# Patient Record
Sex: Male | Born: 1970 | Race: Black or African American | Hispanic: No | Smoking: Never smoker
Health system: Southern US, Community
[De-identification: ages and names within clinical notes are randomized; demographics above are authoritative.]

## PROBLEM LIST (undated history)

## (undated) DIAGNOSIS — I10 Essential (primary) hypertension: Secondary | ICD-10-CM

---

## 2000-11-29 ENCOUNTER — Emergency Department (HOSPITAL_COMMUNITY): Admission: EM | Admit: 2000-11-29 | Discharge: 2000-11-29 | Payer: Self-pay | Admitting: Emergency Medicine

## 2001-09-24 ENCOUNTER — Emergency Department (HOSPITAL_COMMUNITY): Admission: EM | Admit: 2001-09-24 | Discharge: 2001-09-24 | Payer: Self-pay | Admitting: Emergency Medicine

## 2002-01-12 ENCOUNTER — Emergency Department (HOSPITAL_COMMUNITY): Admission: EM | Admit: 2002-01-12 | Discharge: 2002-01-12 | Payer: Self-pay

## 2003-03-24 ENCOUNTER — Emergency Department (HOSPITAL_COMMUNITY): Admission: AD | Admit: 2003-03-24 | Discharge: 2003-03-24 | Payer: Self-pay | Admitting: Emergency Medicine

## 2003-09-01 ENCOUNTER — Emergency Department (HOSPITAL_COMMUNITY): Admission: AD | Admit: 2003-09-01 | Discharge: 2003-09-01 | Payer: Self-pay | Admitting: Family Medicine

## 2004-05-28 ENCOUNTER — Emergency Department (HOSPITAL_COMMUNITY): Admission: EM | Admit: 2004-05-28 | Discharge: 2004-05-28 | Payer: Self-pay | Admitting: Family Medicine

## 2004-08-06 ENCOUNTER — Emergency Department (HOSPITAL_COMMUNITY): Admission: EM | Admit: 2004-08-06 | Discharge: 2004-08-06 | Payer: Self-pay | Admitting: Emergency Medicine

## 2004-09-02 ENCOUNTER — Emergency Department (HOSPITAL_COMMUNITY): Admission: EM | Admit: 2004-09-02 | Discharge: 2004-09-02 | Payer: Self-pay | Admitting: Emergency Medicine

## 2004-10-20 ENCOUNTER — Emergency Department (HOSPITAL_COMMUNITY): Admission: EM | Admit: 2004-10-20 | Discharge: 2004-10-20 | Payer: Self-pay | Admitting: Family Medicine

## 2005-04-28 ENCOUNTER — Emergency Department (HOSPITAL_COMMUNITY): Admission: EM | Admit: 2005-04-28 | Discharge: 2005-04-28 | Payer: Self-pay | Admitting: Emergency Medicine

## 2005-06-07 ENCOUNTER — Emergency Department (HOSPITAL_COMMUNITY): Admission: EM | Admit: 2005-06-07 | Discharge: 2005-06-08 | Payer: Self-pay | Admitting: Emergency Medicine

## 2005-10-20 ENCOUNTER — Emergency Department (HOSPITAL_COMMUNITY): Admission: EM | Admit: 2005-10-20 | Discharge: 2005-10-20 | Payer: Self-pay | Admitting: Family Medicine

## 2006-02-01 ENCOUNTER — Emergency Department (HOSPITAL_COMMUNITY): Admission: EM | Admit: 2006-02-01 | Discharge: 2006-02-01 | Payer: Self-pay | Admitting: Family Medicine

## 2006-02-02 ENCOUNTER — Emergency Department (HOSPITAL_COMMUNITY): Admission: EM | Admit: 2006-02-02 | Discharge: 2006-02-02 | Payer: Self-pay | Admitting: Emergency Medicine

## 2006-09-14 ENCOUNTER — Emergency Department (HOSPITAL_COMMUNITY): Admission: EM | Admit: 2006-09-14 | Discharge: 2006-09-14 | Payer: Self-pay | Admitting: Emergency Medicine

## 2007-05-08 ENCOUNTER — Emergency Department (HOSPITAL_COMMUNITY): Admission: EM | Admit: 2007-05-08 | Discharge: 2007-05-08 | Payer: Self-pay | Admitting: Emergency Medicine

## 2007-06-11 ENCOUNTER — Emergency Department (HOSPITAL_COMMUNITY): Admission: EM | Admit: 2007-06-11 | Discharge: 2007-06-11 | Payer: Self-pay | Admitting: Family Medicine

## 2007-07-12 ENCOUNTER — Emergency Department (HOSPITAL_COMMUNITY): Admission: EM | Admit: 2007-07-12 | Discharge: 2007-07-12 | Payer: Self-pay | Admitting: Family Medicine

## 2008-04-20 ENCOUNTER — Emergency Department (HOSPITAL_COMMUNITY): Admission: EM | Admit: 2008-04-20 | Discharge: 2008-04-20 | Payer: Self-pay | Admitting: Family Medicine

## 2008-06-26 ENCOUNTER — Emergency Department (HOSPITAL_COMMUNITY): Admission: EM | Admit: 2008-06-26 | Discharge: 2008-06-27 | Payer: Self-pay | Admitting: Emergency Medicine

## 2008-07-24 ENCOUNTER — Emergency Department (HOSPITAL_COMMUNITY): Admission: EM | Admit: 2008-07-24 | Discharge: 2008-07-24 | Payer: Self-pay | Admitting: Emergency Medicine

## 2010-11-04 ENCOUNTER — Emergency Department (HOSPITAL_COMMUNITY)
Admission: EM | Admit: 2010-11-04 | Discharge: 2010-11-04 | Payer: Self-pay | Source: Home / Self Care | Admitting: Family Medicine

## 2011-02-05 LAB — POCT URINALYSIS DIPSTICK
Glucose, UA: NEGATIVE mg/dL
Nitrite: NEGATIVE
Protein, ur: 30 mg/dL — AB
Specific Gravity, Urine: >=1.03 (ref 1.005–1.030)
Urobilinogen, UA: 1 mg/dL (ref 0.0–1.0)

## 2011-02-05 LAB — GC/CHLAMYDIA PROBE AMP, GENITAL: GC Probe Amp, Genital: NEGATIVE

## 2011-04-09 ENCOUNTER — Emergency Department (HOSPITAL_COMMUNITY): Payer: Self-pay

## 2011-04-09 ENCOUNTER — Emergency Department (HOSPITAL_COMMUNITY)
Admission: EM | Admit: 2011-04-09 | Discharge: 2011-04-09 | Disposition: A | Discharge status: Home / Self Care | Payer: Self-pay | Attending: Emergency Medicine | Admitting: Emergency Medicine

## 2011-04-09 DIAGNOSIS — R945 Abnormal results of liver function studies: Secondary | ICD-10-CM | POA: Insufficient documentation

## 2011-04-09 DIAGNOSIS — R10819 Abdominal tenderness, unspecified site: Secondary | ICD-10-CM | POA: Insufficient documentation

## 2011-04-09 DIAGNOSIS — M545 Low back pain, unspecified: Secondary | ICD-10-CM | POA: Insufficient documentation

## 2011-04-09 DIAGNOSIS — R142 Eructation: Secondary | ICD-10-CM | POA: Insufficient documentation

## 2011-04-09 DIAGNOSIS — R141 Gas pain: Secondary | ICD-10-CM | POA: Insufficient documentation

## 2011-04-09 DIAGNOSIS — R109 Unspecified abdominal pain: Secondary | ICD-10-CM | POA: Insufficient documentation

## 2011-04-09 DIAGNOSIS — I10 Essential (primary) hypertension: Secondary | ICD-10-CM | POA: Insufficient documentation

## 2011-04-09 DIAGNOSIS — R143 Flatulence: Secondary | ICD-10-CM | POA: Insufficient documentation

## 2011-04-09 LAB — COMPREHENSIVE METABOLIC PANEL
ALT: 66 U/L — ABNORMAL HIGH (ref 0–53)
Albumin: 3.7 g/dL (ref 3.5–5.2)
BUN: 13 mg/dL (ref 6–23)
Calcium: 9.5 mg/dL (ref 8.4–10.5)
Glucose, Bld: 93 mg/dL (ref 70–99)
Sodium: 136 mEq/L (ref 135–145)
Total Protein: 8.1 g/dL (ref 6.0–8.3)

## 2011-04-09 LAB — URINALYSIS, ROUTINE W REFLEX MICROSCOPIC
Nitrite: NEGATIVE
Specific Gravity, Urine: 1.027 (ref 1.005–1.030)
pH: 6.5 (ref 5.0–8.0)

## 2011-04-09 LAB — CBC
HCT: 43.9 % (ref 39.0–52.0)
MCHC: 34.4 g/dL (ref 30.0–36.0)
Platelets: 251 10*3/uL (ref 150–400)
RDW: 14.7 % (ref 11.5–15.5)

## 2011-04-09 LAB — DIFFERENTIAL
Basophils Absolute: 0 10*3/uL (ref 0.0–0.1)
Eosinophils Absolute: 0.2 10*3/uL (ref 0.0–0.7)
Eosinophils Relative: 2 % (ref 0–5)
Lymphocytes Relative: 22 % (ref 12–46)
Monocytes Absolute: 0.9 10*3/uL (ref 0.1–1.0)

## 2011-04-09 LAB — LIPASE, BLOOD: Lipase: 20 U/L (ref 11–59)

## 2011-08-22 LAB — POCT URINALYSIS DIP (DEVICE)
Nitrite: NEGATIVE
Operator id: 247071
Protein, ur: NEGATIVE
Urobilinogen, UA: 0.2

## 2011-08-22 LAB — GC/CHLAMYDIA PROBE AMP, GENITAL: Chlamydia, DNA Probe: NEGATIVE

## 2011-08-24 LAB — POCT I-STAT, CHEM 8
Hemoglobin: 15.6
Sodium: 141
TCO2: 28

## 2011-09-07 LAB — POCT URINALYSIS DIP (DEVICE)
Glucose, UA: NEGATIVE
Nitrite: NEGATIVE
Operator id: 240961
Urobilinogen, UA: 0.2

## 2011-09-13 LAB — GC/CHLAMYDIA PROBE AMP, GENITAL: Chlamydia, DNA Probe: NEGATIVE

## 2011-10-21 ENCOUNTER — Emergency Department (HOSPITAL_COMMUNITY)
Admission: EM | Admit: 2011-10-21 | Discharge: 2011-10-22 | Disposition: A | Discharge status: Home / Self Care | Payer: BC Managed Care – PPO | Attending: Emergency Medicine | Admitting: Emergency Medicine

## 2011-10-21 ENCOUNTER — Encounter: Payer: Self-pay | Admitting: Emergency Medicine

## 2011-10-21 DIAGNOSIS — M79609 Pain in unspecified limb: Secondary | ICD-10-CM | POA: Insufficient documentation

## 2011-10-21 DIAGNOSIS — Z79899 Other long term (current) drug therapy: Secondary | ICD-10-CM | POA: Insufficient documentation

## 2011-10-21 DIAGNOSIS — R142 Eructation: Secondary | ICD-10-CM | POA: Insufficient documentation

## 2011-10-21 DIAGNOSIS — E119 Type 2 diabetes mellitus without complications: Secondary | ICD-10-CM | POA: Diagnosis present

## 2011-10-21 DIAGNOSIS — J189 Pneumonia, unspecified organism: Secondary | ICD-10-CM | POA: Insufficient documentation

## 2011-10-21 DIAGNOSIS — F17200 Nicotine dependence, unspecified, uncomplicated: Secondary | ICD-10-CM | POA: Insufficient documentation

## 2011-10-21 DIAGNOSIS — R141 Gas pain: Secondary | ICD-10-CM | POA: Insufficient documentation

## 2011-10-21 DIAGNOSIS — R109 Unspecified abdominal pain: Secondary | ICD-10-CM | POA: Insufficient documentation

## 2011-10-21 DIAGNOSIS — I10 Essential (primary) hypertension: Secondary | ICD-10-CM | POA: Diagnosis present

## 2011-10-21 DIAGNOSIS — R7401 Elevation of levels of liver transaminase levels: Secondary | ICD-10-CM | POA: Insufficient documentation

## 2011-10-21 DIAGNOSIS — K429 Umbilical hernia without obstruction or gangrene: Secondary | ICD-10-CM

## 2011-10-21 DIAGNOSIS — R7402 Elevation of levels of lactic acid dehydrogenase (LDH): Secondary | ICD-10-CM | POA: Insufficient documentation

## 2011-10-21 HISTORY — DX: Essential (primary) hypertension: I10

## 2011-10-21 LAB — CBC
Platelets: 234 10*3/uL (ref 150–400)
RBC: 5.2 MIL/uL (ref 4.22–5.81)
WBC: 7.7 10*3/uL (ref 4.0–10.5)

## 2011-10-21 LAB — DIFFERENTIAL
Basophils Absolute: 0 10*3/uL (ref 0.0–0.1)
Lymphocytes Relative: 27 % (ref 12–46)
Lymphs Abs: 2.1 10*3/uL (ref 0.7–4.0)
Neutro Abs: 4.6 10*3/uL (ref 1.7–7.7)
Neutrophils Relative %: 60 % (ref 43–77)

## 2011-10-21 LAB — LIPASE, BLOOD: Lipase: 23 U/L (ref 11–59)

## 2011-10-21 LAB — COMPREHENSIVE METABOLIC PANEL
AST: 56 U/L — ABNORMAL HIGH (ref 0–37)
CO2: 23 mEq/L (ref 19–32)
Calcium: 9.4 mg/dL (ref 8.4–10.5)
Creatinine, Ser: 1.18 mg/dL (ref 0.50–1.35)
GFR calc Af Amer: 88 mL/min — ABNORMAL LOW (ref >90)
GFR calc non Af Amer: 76 mL/min — ABNORMAL LOW (ref >90)

## 2011-10-21 NOTE — ED Notes (Signed)
REPORT GIVEN TO CDU NURSE , UNABLE TO GIVE URINE SPECIMEN AT THIS TIME , INTRUCTIONS GIVEN , ORAL CONTRAST GIVEN TO PT. BY CT TECH.

## 2011-10-21 NOTE — ED Notes (Signed)
PT. 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

## 2011-10-21 NOTE — ED Provider Notes (Addendum)
History     CSN: [id number] Arrival date & time: 10/21/2011  8:24 PM   First MD Initiated Contact with Patient 10/21/11 2302      Chief Complaint  Patient presents with  . Abdominal Pain    (Consider location/radiation/quality/duration/timing/severity/associated sxs/prior treatment) HPI 40 year old gentleman presents to our department with complaint of lower abdominal pain extending into his legs over the last 2-3 months. Patient reports pain is mainly when he is up and walking. He denies any weight loss but does report his belly is getting bigger. Patient denies any nausea vomiting or diarrhea. No problems with constipation. No fevers or chills. Patient was seen in the emergency department in may for abdominal pain, and at that time he had elevated LFTs. Patient reports he followed up with his primary care Dr. but no action was taken for it. Labs today show persistent elevation in LFTs. 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 Patient also reports shortness of breath and cough ongoing for the last month. Patient denies being a smoker. Patient is a [employer]. Patient has past medical history significant for diabetes and hypertension Past Medical History  Diagnosis Date  . Diabetes mellitus   . Hypertension     History reviewed. No pertinent past surgical history.  No family history on file.  History  Substance Use Topics  . Smoking status: Current Everyday Smoker  . Smokeless tobacco: Not on file  . Alcohol Use: Yes     OCCASIONAL      Review of Systems  All other systems reviewed and are negative.    Allergies  Review of patient's allergies indicates no known allergies.  Home Medications   Current Outpatient Rx  Name Route Sig Dispense Refill  . AMLODIPINE BESYLATE-VALSARTAN 5-160 MG PO TABS Oral Take 1 tablet by mouth daily.      . IBUPROFEN 600 MG PO TABS Oral Take 600 mg by mouth every 4 (four) hours as  needed. For pain     . METFORMIN HCL 500 MG PO TABS Oral Take 500 mg by mouth daily with breakfast.      . NIACIN (ANTIHYPERLIPIDEMIC) 1000 MG PO TBCR Oral Take 1,000 mg by mouth at bedtime.        BP 138/76  Pulse 90  Temp(Src) 98 F (36.7 C) (Oral)  Resp 20  SpO2 99%  Physical Exam  Nursing note and vitals reviewed. Constitutional: He is oriented to person, place, and time. He appears well-developed and well-nourished.  HENT:  Head: Normocephalic and atraumatic.  Nose: Nose normal.  Mouth/Throat: Oropharynx is clear and moist.  Eyes: Conjunctivae and EOM are normal. Pupils are equal, round, and reactive to light.  Neck: Normal range of motion. Neck supple. No JVD present. No tracheal deviation present. No thyromegaly present.  Cardiovascular: Normal rate, regular rhythm, normal heart sounds and intact distal pulses.  Exam reveals no gallop and no friction rub.   No murmur heard. Pulmonary/Chest: Effort normal and breath sounds normal. No stridor. No respiratory distress. He has no wheezes. He has no rales. He exhibits no tenderness.  Abdominal: Soft. Bowel sounds are normal. He exhibits distension (mild distention of the abdomen, but soft and nontender) and mass (umbilical hernia that is easily reducible and at its base is 2 cm). There is no tenderness. There is no rebound and no guarding.  Musculoskeletal: Normal range of motion. He exhibits no edema and no tenderness.  Lymphadenopathy:    He has no  cervical adenopathy.  Neurological: He is oriented to person, place, and time. He has normal reflexes. No cranial nerve deficit. He exhibits normal muscle tone. Coordination normal.  Skin: Skin is dry. No rash noted. No erythema. No pallor.  Psychiatric: He has a normal mood and affect. His behavior is normal. Judgment and thought content normal.    ED Course  Procedures (including critical care time)  Labs Reviewed  COMPREHENSIVE METABOLIC PANEL - Abnormal; Notable for the  following:    AST 56 (*)    ALT 80 (*)    GFR calc non Af Amer 76 (*)    GFR calc Af Amer 88 (*)    All other components within normal limits  URINALYSIS, ROUTINE W REFLEX MICROSCOPIC  CBC  DIFFERENTIAL  LIPASE, BLOOD   No results found.   No diagnosis found.    MDM  40 year old male with ongoing abdominal pain, weight gain and abdominal distention. Patient also complaining of shortness of breath and cough. Will check chest x-ray, baseline labs, and get CT of abdomen pelvis for possible ascites attributed to liver disease.        Olivia Mackie, MD 10/21/11 2339 2:17 AM CT scan shows no ascites, fatty infiltration of the liver. Will have him followup with his doctors at the blank also give him followup with the GI clinic sundown. Chest x-ray concern for mild pneumonia will start him on Z-Pak for same.  Olivia Mackie, MD 10/22/11 [phone]

## 2011-10-21 NOTE — ED Notes (Signed)
Introduced self , call light within reach , denies pain at this time , respirations unlabored.

## 2011-10-22 ENCOUNTER — Emergency Department (HOSPITAL_COMMUNITY): Payer: BC Managed Care – PPO

## 2011-10-22 LAB — URINALYSIS, ROUTINE W REFLEX MICROSCOPIC
Leukocytes, UA: NEGATIVE
Nitrite: NEGATIVE
Specific Gravity, Urine: 1.026 (ref 1.005–1.030)
Urobilinogen, UA: 0.2 mg/dL (ref 0.0–1.0)

## 2011-10-22 LAB — URINE MICROSCOPIC-ADD ON

## 2011-10-22 MED ORDER — IOHEXOL 300 MG/ML  SOLN
100.0000 mL | Freq: Once | INTRAMUSCULAR | Status: AC | PRN
  Administered 2011-10-22: 100 mL via INTRAVENOUS

## 2011-10-22 MED ORDER — AZITHROMYCIN 250 MG PO TABS
500.0000 mg | ORAL_TABLET | Freq: Once | ORAL | Status: AC
  Administered 2011-10-22: 500 mg via ORAL
  Filled 2011-10-22: qty 2

## 2011-10-22 MED ORDER — OXYCODONE HCL 5 MG PO TABS: 5.0000 mg | ORAL_TABLET | ORAL | Status: AC | PRN

## 2011-10-22 MED ORDER — AZITHROMYCIN 250 MG PO TABS: 250.0000 mg | ORAL_TABLET | Freq: Every day | ORAL | Status: AC

## 2011-10-22 NOTE — ED Notes (Signed)
Patient transported to CT 

## 2011-10-22 NOTE — ED Notes (Signed)
Pt NAD, AOx4, resp e/u, pt states understanding of discharge instructions and denies questions at time of discharge. Pt ambulatory with steady gait.

## 2012-09-09 ENCOUNTER — Ambulatory Visit
Admission: RE | Admit: 2012-09-09 | Discharge: 2012-09-09 | Disposition: A | Discharge status: Home / Self Care | Payer: BC Managed Care – PPO | Source: Ambulatory Visit | Attending: Family Medicine | Admitting: Family Medicine

## 2012-09-09 ENCOUNTER — Other Ambulatory Visit: Payer: Self-pay | Admitting: Family Medicine

## 2012-09-09 DIAGNOSIS — R109 Unspecified abdominal pain: Secondary | ICD-10-CM

## 2012-12-26 ENCOUNTER — Encounter (HOSPITAL_COMMUNITY): Payer: Self-pay | Admitting: *Deleted

## 2012-12-26 ENCOUNTER — Emergency Department (HOSPITAL_COMMUNITY)
Admission: EM | Admit: 2012-12-26 | Discharge: 2012-12-27 | Disposition: A | Discharge status: Home / Self Care | Payer: BC Managed Care – PPO | Attending: Emergency Medicine | Admitting: Emergency Medicine

## 2012-12-26 DIAGNOSIS — F17200 Nicotine dependence, unspecified, uncomplicated: Secondary | ICD-10-CM | POA: Insufficient documentation

## 2012-12-26 DIAGNOSIS — N390 Urinary tract infection, site not specified: Secondary | ICD-10-CM

## 2012-12-26 DIAGNOSIS — E119 Type 2 diabetes mellitus without complications: Secondary | ICD-10-CM | POA: Insufficient documentation

## 2012-12-26 DIAGNOSIS — Z79899 Other long term (current) drug therapy: Secondary | ICD-10-CM | POA: Insufficient documentation

## 2012-12-26 DIAGNOSIS — I10 Essential (primary) hypertension: Secondary | ICD-10-CM | POA: Insufficient documentation

## 2012-12-26 LAB — COMPREHENSIVE METABOLIC PANEL
BUN: 11 mg/dL (ref 6–23)
CO2: 29 mEq/L (ref 19–32)
Calcium: 9.3 mg/dL (ref 8.4–10.5)
Creatinine, Ser: 1.18 mg/dL (ref 0.50–1.35)
GFR calc Af Amer: 87 mL/min — ABNORMAL LOW (ref >90)
GFR calc non Af Amer: 75 mL/min — ABNORMAL LOW (ref >90)
Glucose, Bld: 105 mg/dL — ABNORMAL HIGH (ref 70–99)
Total Bilirubin: 0.3 mg/dL (ref 0.3–1.2)

## 2012-12-26 LAB — CBC WITH DIFFERENTIAL/PLATELET
Eosinophils Relative: 2 % (ref 0–5)
HCT: 42.1 % (ref 39.0–52.0)
Hemoglobin: 14.4 g/dL (ref 13.0–17.0)
Lymphocytes Relative: 22 % (ref 12–46)
Lymphs Abs: 2.3 10*3/uL (ref 0.7–4.0)
MCV: 81.4 fL (ref 78.0–100.0)
Monocytes Absolute: 1.1 10*3/uL — ABNORMAL HIGH (ref 0.1–1.0)
Monocytes Relative: 11 % (ref 3–12)
RBC: 5.17 MIL/uL (ref 4.22–5.81)
RDW: 14.8 % (ref 11.5–15.5)
WBC: 10.7 10*3/uL — ABNORMAL HIGH (ref 4.0–10.5)

## 2012-12-26 LAB — URINE MICROSCOPIC-ADD ON

## 2012-12-26 LAB — URINALYSIS, ROUTINE W REFLEX MICROSCOPIC
Glucose, UA: NEGATIVE mg/dL
Hgb urine dipstick: NEGATIVE
Protein, ur: NEGATIVE mg/dL

## 2012-12-26 MED ORDER — CEPHALEXIN 250 MG PO CAPS
500.0000 mg | ORAL_CAPSULE | Freq: Once | ORAL | Status: AC
  Administered 2012-12-26: 500 mg via ORAL
  Filled 2012-12-26: qty 2

## 2012-12-26 MED ORDER — CEPHALEXIN 500 MG PO CAPS: 500.0000 mg | ORAL_CAPSULE | Freq: Four times a day (QID) | ORAL | Status: DC

## 2012-12-26 MED ORDER — IBUPROFEN 800 MG PO TABS: 800.0000 mg | ORAL_TABLET | Freq: Three times a day (TID) | ORAL | Status: DC | PRN

## 2012-12-26 NOTE — ED Notes (Signed)
The  Pt is c/o  Generalized abd pain for 2 weeks no n or v.  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

## 2012-12-26 NOTE — ED Provider Notes (Signed)
History     CSN: [id number]  Arrival date & time 12/26/12  2034   First MD Initiated Contact with Patient 12/26/12 2217      Chief Complaint  Patient presents with  . Abdominal Pain    (Consider location/radiation/quality/duration/timing/severity/associated sxs/prior treatment) Patient is a 42 y.o. male presenting with abdominal pain. The history is provided by the patient (the pt complains of lower abd pain for 2 weeks.  no vomiting). No language interpreter was used.  Abdominal Pain The primary symptoms of the illness include abdominal pain. The primary symptoms of the illness do not include fatigue or diarrhea. The current episode started more than 2 days ago. The onset of the illness was gradual. The problem has not changed since onset. Associated with: nothing. Risk factors: non. Symptoms associated with the illness do not include chills, hematuria, frequency or back pain.    Past Medical History  Diagnosis Date  . Diabetes mellitus   . Hypertension     History reviewed. No pertinent past surgical history.  No family history on file.  History  Substance Use Topics  . Smoking status: Current Every Day Smoker  . Smokeless tobacco: Not on file  . Alcohol Use: Yes     Comment: OCCASIONAL      Review of Systems  Constitutional: Negative for chills and fatigue.  HENT: Negative for congestion, sinus pressure and ear discharge.   Eyes: Negative for discharge.  Respiratory: Negative for cough.   Cardiovascular: Negative for chest pain.  Gastrointestinal: Positive for abdominal pain. Negative for diarrhea.  Genitourinary: Negative for frequency and hematuria.  Musculoskeletal: Negative for back pain.  Skin: Negative for rash.  Neurological: Negative for seizures and headaches.  Hematological: Negative.   Psychiatric/Behavioral: Negative for hallucinations.    Allergies  Review of patient's allergies indicates no known allergies.  Home Medications   Current  Outpatient Rx  Name  Route  Sig  Dispense  Refill  . AMLODIPINE BESYLATE-VALSARTAN 5-160 MG PO TABS   Oral   Take 1 tablet by mouth daily.           . IBUPROFEN 600 MG PO TABS   Oral   Take 600 mg by mouth every 4 (four) hours as needed. For pain          . METFORMIN HCL 500 MG PO TABS   Oral   Take 500 mg by mouth daily with breakfast.             BP 139/75  Pulse 104  Temp 98.9 F (37.2 C) (Oral)  Resp 20  SpO2 97%  Physical Exam  Constitutional: He is oriented to person, place, and time. He appears well-developed.  HENT:  Head: Normocephalic and atraumatic.  Eyes: Conjunctivae normal and EOM are normal. No scleral icterus.  Neck: Neck supple. No thyromegaly present.  Cardiovascular: Normal rate and regular rhythm.  Exam reveals no gallop and no friction rub.   No murmur heard. Pulmonary/Chest: No stridor. He has no wheezes. He has no rales. He exhibits no tenderness.  Abdominal: He exhibits no distension. There is tenderness. There is no rebound.       Tender suprapubic  Musculoskeletal: Normal range of motion. He exhibits no edema.  Lymphadenopathy:    He has no cervical adenopathy.  Neurological: He is oriented to person, place, and time. Coordination normal.  Skin: No rash noted. No erythema.  Psychiatric: He has a normal mood and affect. His behavior is normal.    ED Course  Procedures (including critical care time)  Labs Reviewed  URINALYSIS, ROUTINE W REFLEX MICROSCOPIC - Abnormal; Notable for the following:    APPearance TURBID (*)     All other components within normal limits  CBC WITH DIFFERENTIAL - Abnormal; Notable for the following:    WBC 10.7 (*)     Monocytes Absolute 1.1 (*)     All other components within normal limits  COMPREHENSIVE METABOLIC PANEL - Abnormal; Notable for the following:    Glucose, Bld 105 (*)     AST 42 (*)     GFR calc non Af Amer 75 (*)     GFR calc Af Amer 87 (*)     All other components within normal limits   URINE MICROSCOPIC-ADD ON - Abnormal; Notable for the following:    Bacteria, UA MANY (*)     All other components within normal limits  LIPASE, BLOOD  URINE CULTURE   No results found.   No diagnosis found.    MDM          Benny Lennert, MD 12/26/12 [phone]

## 2012-12-27 ENCOUNTER — Emergency Department (HOSPITAL_COMMUNITY): Payer: BC Managed Care – PPO

## 2012-12-28 LAB — URINE CULTURE: Culture: NO GROWTH

## 2015-06-21 ENCOUNTER — Encounter (HOSPITAL_COMMUNITY): Payer: Self-pay | Admitting: *Deleted

## 2015-06-21 ENCOUNTER — Emergency Department (HOSPITAL_COMMUNITY)
Admission: EM | Admit: 2015-06-21 | Discharge: 2015-06-21 | Disposition: A | Discharge status: Home / Self Care | Payer: BLUE CROSS/BLUE SHIELD | Attending: Emergency Medicine | Admitting: Emergency Medicine

## 2015-06-21 DIAGNOSIS — E119 Type 2 diabetes mellitus without complications: Secondary | ICD-10-CM | POA: Insufficient documentation

## 2015-06-21 DIAGNOSIS — R1030 Lower abdominal pain, unspecified: Secondary | ICD-10-CM | POA: Diagnosis not present

## 2015-06-21 DIAGNOSIS — M79652 Pain in left thigh: Secondary | ICD-10-CM | POA: Insufficient documentation

## 2015-06-21 DIAGNOSIS — M549 Dorsalgia, unspecified: Secondary | ICD-10-CM | POA: Insufficient documentation

## 2015-06-21 DIAGNOSIS — M25552 Pain in left hip: Secondary | ICD-10-CM | POA: Insufficient documentation

## 2015-06-21 DIAGNOSIS — Z720 Tobacco use: Secondary | ICD-10-CM | POA: Diagnosis not present

## 2015-06-21 DIAGNOSIS — R1032 Left lower quadrant pain: Secondary | ICD-10-CM | POA: Diagnosis present

## 2015-06-21 DIAGNOSIS — I10 Essential (primary) hypertension: Secondary | ICD-10-CM | POA: Diagnosis not present

## 2015-06-21 LAB — CBC WITH DIFFERENTIAL/PLATELET
BASOS ABS: 0 10*3/uL (ref 0.0–0.1)
Basophils Relative: 0 % (ref 0–1)
EOS PCT: 1 % (ref 0–5)
Eosinophils Absolute: 0.1 10*3/uL (ref 0.0–0.7)
HCT: 41.1 % (ref 39.0–52.0)
HEMOGLOBIN: 13.8 g/dL (ref 13.0–17.0)
LYMPHS ABS: 2.6 10*3/uL (ref 0.7–4.0)
LYMPHS PCT: 25 % (ref 12–46)
MCH: 27.1 pg (ref 26.0–34.0)
MCHC: 33.6 g/dL (ref 30.0–36.0)
MCV: 80.6 fL (ref 78.0–100.0)
MONO ABS: 1.2 10*3/uL — AB (ref 0.1–1.0)
MONOS PCT: 11 % (ref 3–12)
NEUTROS PCT: 63 % (ref 43–77)
Neutro Abs: 6.7 10*3/uL (ref 1.7–7.7)
Platelets: 273 10*3/uL (ref 150–400)
RBC: 5.1 MIL/uL (ref 4.22–5.81)
RDW: 15.2 % (ref 11.5–15.5)
WBC: 10.6 10*3/uL — AB (ref 4.0–10.5)

## 2015-06-21 LAB — BASIC METABOLIC PANEL
Anion gap: 8 (ref 5–15)
BUN: 11 mg/dL (ref 6–20)
CO2: 25 mmol/L (ref 22–32)
Calcium: 8.8 mg/dL — ABNORMAL LOW (ref 8.9–10.3)
Chloride: 103 mmol/L (ref 101–111)
Creatinine, Ser: 1.35 mg/dL — ABNORMAL HIGH (ref 0.61–1.24)
GFR calc non Af Amer: >60 mL/min (ref >60)
GLUCOSE: 93 mg/dL (ref 65–99)
POTASSIUM: 4 mmol/L (ref 3.5–5.1)
SODIUM: 136 mmol/L (ref 135–145)

## 2015-06-21 LAB — URINALYSIS, ROUTINE W REFLEX MICROSCOPIC
BILIRUBIN URINE: NEGATIVE
GLUCOSE, UA: NEGATIVE mg/dL
KETONES UR: NEGATIVE mg/dL
Leukocytes, UA: NEGATIVE
Nitrite: NEGATIVE
PH: 6 (ref 5.0–8.0)
PROTEIN: NEGATIVE mg/dL
SPECIFIC GRAVITY, URINE: 1.024 (ref 1.005–1.030)
UROBILINOGEN UA: 1 mg/dL (ref 0.0–1.0)

## 2015-06-21 LAB — URINE MICROSCOPIC-ADD ON

## 2015-06-21 LAB — I-STAT CG4 LACTIC ACID, ED: Lactic Acid, Venous: 0.85 mmol/L (ref 0.5–2.0)

## 2015-06-21 MED ORDER — HYDROCODONE-ACETAMINOPHEN 5-325 MG PO TABS: 1.0000 | ORAL_TABLET | ORAL | Status: DC | PRN

## 2015-06-21 MED ORDER — FENTANYL CITRATE (PF) 100 MCG/2ML IJ SOLN
50.0000 ug | Freq: Once | INTRAMUSCULAR | Status: DC
  Filled 2015-06-21: qty 2

## 2015-06-21 MED ORDER — NAPROXEN 500 MG PO TABS: 500.0000 mg | ORAL_TABLET | Freq: Two times a day (BID) | ORAL | Status: DC

## 2015-06-21 MED ORDER — METHOCARBAMOL 500 MG PO TABS: 500.0000 mg | ORAL_TABLET | Freq: Two times a day (BID) | ORAL | Status: DC

## 2015-06-21 MED ORDER — OXYCODONE-ACETAMINOPHEN 5-325 MG PO TABS
2.0000 | ORAL_TABLET | Freq: Once | ORAL | Status: AC
Start: 2015-06-21 — End: 2015-06-21
  Administered 2015-06-21: 2 via ORAL
  Filled 2015-06-21: qty 2

## 2015-06-21 NOTE — ED Notes (Signed)
Pt in c/o generalized groin pain, also swelling to genital area, pain started on Thursday, increased tonight, denies redness

## 2015-06-21 NOTE — ED Provider Notes (Signed)
CSN: [id number]     Arrival date & time 06/21/15  0201 History   First MD Initiated Contact with Patient 06/21/15 [phone]     Chief Complaint  Patient presents with  . Groin Pain     (Consider location/radiation/quality/duration/timing/severity/associated sxs/prior Treatment) HPI Reginald Gonzalez is a 44 y.o. male with a history of diabetes and high blood pressure, comes in for evaluation of bilateral groin pain. 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 She likens this sensation to "being constipated and having a bowel movement". He denies any urinary symptoms, testicular pain or swelling, penile pain or discharge, fevers, chills, abdominal pain, nausea or vomiting, back pain, dark or bloody stools. Reports last bowel movement was last night at 9:30 PM and was normal for him. Rates pain as 10/10.  Past Medical History  Diagnosis Date  . Diabetes mellitus   . Hypertension    History reviewed. No pertinent past surgical history. History reviewed. No pertinent family history. History  Substance Use Topics  . Smoking status: Current Every Day Smoker  . Smokeless tobacco: Not on file  . Alcohol Use: Yes     Comment: OCCASIONAL    Review of Systems A 10 point review of systems was completed and was negative except for pertinent positives and negatives as mentioned in the history of present illness     Allergies  Review of patient's allergies indicates no known allergies.  Home Medications   Prior to Admission medications   Medication Sig Start Date End Date Taking? Authorizing Provider  amLODipine-valsartan (EXFORGE) 5-160 MG per tablet Take 1 tablet by mouth daily.     Yes Historical Provider, MD  ibuprofen (ADVIL,MOTRIN) 800 MG tablet Take 1 tablet (800 mg total) by mouth every 8 (eight) hours as needed for pain. 12/26/12  Yes Bethann Berkshire, MD  HYDROcodone-acetaminophen  (NORCO/VICODIN) 5-325 MG per tablet Take 1-2 tablets by mouth every 4 (four) hours as needed. 06/21/15   Joycie Peek, PA-C  methocarbamol (ROBAXIN) 500 MG tablet Take 1 tablet (500 mg total) by mouth 2 (two) times daily. 06/21/15   Joycie Peek, PA-C  naproxen (NAPROSYN) 500 MG tablet Take 1 tablet (500 mg total) by mouth 2 (two) times daily. 06/21/15   Joycie Peek, PA-C   BP 135/86 mmHg  Pulse 87  Temp(Src) 99.1 F (37.3 C) (Oral)  Resp 16  SpO2 94% Physical Exam  Constitutional: He is oriented to person, place, and time. He appears well-developed and well-nourished.  HENT:  Head: Normocephalic and atraumatic.  Mouth/Throat: Oropharynx is clear and moist.  Eyes: Conjunctivae are normal. Pupils are equal, round, and reactive to light. Right eye exhibits no discharge. Left eye exhibits no discharge. No scleral icterus.  Neck: Neck supple.  Cardiovascular: Normal rate, regular rhythm and normal heart sounds.   Pulmonary/Chest: Effort normal and breath sounds normal. No respiratory distress. He has no wheezes. He has no rales.  Abdominal: Soft. There is no tenderness.  Genitourinary:  Rectal exam is normal. No evidence of hemorrhoid or fissure. Rectal vault is unremarkable with no masses or other abnormalities noted. Soft brown stool on exam glove. No other evidence of infection No penile tenderness, lesions or deformities. No discharge. No inguinal lymphadenopathy.  Musculoskeletal: He exhibits no tenderness.  Discomfort is reproduced with internal and external rotation of left hip. No focal tenderness noted. No erythema or swelling noted to proximal thigh. No tenderness  along the venous system.  Neurological: He is alert and oriented to person, place, and time.  Cranial Nerves II-XII grossly intact  Skin: Skin is warm and dry. No rash noted.  Psychiatric: He has a normal mood and affect.  Nursing note and vitals reviewed.   ED Course  Procedures (including critical care  time) Labs Review Labs Reviewed  CBC WITH DIFFERENTIAL/PLATELET - Abnormal; Notable for the following:    WBC 10.6 (*)    Monocytes Absolute 1.2 (*)    All other components within normal limits  BASIC METABOLIC PANEL - Abnormal; Notable for the following:    Creatinine, Ser 1.35 (*)    Calcium 8.8 (*)    All other components within normal limits  URINALYSIS, ROUTINE W REFLEX MICROSCOPIC (NOT AT [facility]) - Abnormal; Notable for the following:    Hgb urine dipstick TRACE (*)    All other components within normal limits  URINE MICROSCOPIC-ADD ON  I-STAT CG4 LACTIC ACID, ED    Imaging Review No results found.   EKG Interpretation None     Meds given in ED:  Medications  oxyCODONE-acetaminophen (PERCOCET/ROXICET) 5-325 MG per tablet 2 tablet (2 tablets Oral Given 06/21/15 0649)    New Prescriptions   HYDROCODONE-ACETAMINOPHEN (NORCO/VICODIN) 5-325 MG PER TABLET    Take 1-2 tablets by mouth every 4 (four) hours as needed.   METHOCARBAMOL (ROBAXIN) 500 MG TABLET    Take 1 tablet (500 mg total) by mouth 2 (two) times daily.   NAPROXEN (NAPROSYN) 500 MG TABLET    Take 1 tablet (500 mg total) by mouth 2 (two) times daily.   Filed Vitals:   06/21/15 0616 06/21/15 0630 06/21/15 0645 06/21/15 0740  BP: 140/63  131/72 135/86  Pulse: 88 91 86 87  Temp:      TempSrc:      Resp: 16   16  SpO2: 99% 100% 99% 94%    MDM  Vitals stable - WNL -afebrile Pt resting comfortably in ED. discomfort improved after administration of oral analgesia. PE--physical exam as above. Benign abdominal and GU exams. Labwork--labs are noncontributory. No leukocytosis, lactic acid negative.  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 Low concern for infection at this time. Doubt prostatitis or other GU infection. Doubt hernia. No evidence of DVT. We'll treat for presumed musculoskeletal pain with anti-inflammatory, muscle relaxers and  short course pain medicines.  I discussed all relevant lab findings and imaging results with pt and they verbalized understanding. Discussed f/u with PCP within 48 hrs and return precautions, pt very amenable to plan. Prior to patient discharge, I discussed and reviewed this case with Dr. Norlene Campbell, who also saw and evaluated the patient.   Final diagnoses:  Groin discomfort, unspecified laterality        Joycie Peek, PA-C 06/21/15 1610  Marisa Severin, MD 06/21/15 1725

## 2015-06-21 NOTE — Discharge Instructions (Signed)
You were evaluated in the ED for your groin pain. There is not appear to be an emergent cause for your symptoms at this time. Your exam, labs were all reassuring. Please take your naproxen for mild to moderate discomfort. You may take your Norco for severe pain. The Robaxin is a muscle relaxer. You may take your pain medicine and Robaxin but not before driving or operating machinery. Please follow-up with your primary care. Return to ED for any worsening symptoms.

## 2015-12-25 ENCOUNTER — Encounter (HOSPITAL_COMMUNITY): Payer: Self-pay | Admitting: Emergency Medicine

## 2015-12-25 ENCOUNTER — Emergency Department (HOSPITAL_COMMUNITY)
Admission: EM | Admit: 2015-12-25 | Discharge: 2015-12-26 | Disposition: A | Discharge status: Home / Self Care | Payer: BLUE CROSS/BLUE SHIELD | Attending: Emergency Medicine | Admitting: Emergency Medicine

## 2015-12-25 DIAGNOSIS — F17200 Nicotine dependence, unspecified, uncomplicated: Secondary | ICD-10-CM | POA: Diagnosis not present

## 2015-12-25 DIAGNOSIS — R1030 Lower abdominal pain, unspecified: Secondary | ICD-10-CM | POA: Insufficient documentation

## 2015-12-25 DIAGNOSIS — Z791 Long term (current) use of non-steroidal anti-inflammatories (NSAID): Secondary | ICD-10-CM | POA: Insufficient documentation

## 2015-12-25 DIAGNOSIS — Z79899 Other long term (current) drug therapy: Secondary | ICD-10-CM | POA: Insufficient documentation

## 2015-12-25 DIAGNOSIS — I10 Essential (primary) hypertension: Secondary | ICD-10-CM | POA: Diagnosis not present

## 2015-12-25 DIAGNOSIS — E119 Type 2 diabetes mellitus without complications: Secondary | ICD-10-CM | POA: Insufficient documentation

## 2015-12-25 LAB — COMPREHENSIVE METABOLIC PANEL
ALT: 39 U/L (ref 17–63)
ANION GAP: 10 (ref 5–15)
AST: 35 U/L (ref 15–41)
Albumin: 3.3 g/dL — ABNORMAL LOW (ref 3.5–5.0)
Alkaline Phosphatase: 94 U/L (ref 38–126)
BUN: 8 mg/dL (ref 6–20)
CHLORIDE: 104 mmol/L (ref 101–111)
CO2: 27 mmol/L (ref 22–32)
CREATININE: 1.23 mg/dL (ref 0.61–1.24)
Calcium: 9.3 mg/dL (ref 8.9–10.3)
Glucose, Bld: 92 mg/dL (ref 65–99)
Potassium: 3.7 mmol/L (ref 3.5–5.1)
SODIUM: 141 mmol/L (ref 135–145)
Total Bilirubin: 0.5 mg/dL (ref 0.3–1.2)
Total Protein: 7.5 g/dL (ref 6.5–8.1)

## 2015-12-25 LAB — URINE MICROSCOPIC-ADD ON

## 2015-12-25 LAB — CBC
HCT: 44.2 % (ref 39.0–52.0)
HEMOGLOBIN: 14.4 g/dL (ref 13.0–17.0)
MCH: 26.1 pg (ref 26.0–34.0)
MCHC: 32.6 g/dL (ref 30.0–36.0)
MCV: 80.2 fL (ref 78.0–100.0)
PLATELETS: 266 10*3/uL (ref 150–400)
RBC: 5.51 MIL/uL (ref 4.22–5.81)
RDW: 15.2 % (ref 11.5–15.5)
WBC: 9.1 10*3/uL (ref 4.0–10.5)

## 2015-12-25 LAB — URINALYSIS, ROUTINE W REFLEX MICROSCOPIC
Bilirubin Urine: NEGATIVE
GLUCOSE, UA: NEGATIVE mg/dL
Ketones, ur: NEGATIVE mg/dL
LEUKOCYTES UA: NEGATIVE
Nitrite: NEGATIVE
PROTEIN: NEGATIVE mg/dL
SPECIFIC GRAVITY, URINE: 1.025 (ref 1.005–1.030)
pH: 5.5 (ref 5.0–8.0)

## 2015-12-25 LAB — LIPASE, BLOOD: LIPASE: 23 U/L (ref 11–51)

## 2015-12-25 NOTE — ED Notes (Signed)
C/o intermittent abd pain x 2 weeks.  Denies nausea, vomiting, and urinary complaints.  Reports diarrhea earlier but none today.

## 2015-12-25 NOTE — ED Provider Notes (Signed)
CSN: [id number]     Arrival date & time 12/25/15  2215 History   First MD Initiated Contact with Patient 12/25/15 2245     Chief Complaint  Patient presents with  . Abdominal Pain     (Consider location/radiation/quality/duration/timing/severity/associated sxs/prior Treatment) HPI Comments: Bilateral lower abdominal cramping type pain for 2 weeks, constant for the past 1 week. No constipation or hematochezia. Last BM just prior to arrival. No urinary symptoms, no fever. He has not taken anything for the discomfort. No alleviating or aggravating factors.    Patient is a 45 y.o. male presenting with abdominal pain. The history is provided by the patient. No language interpreter was used.  Abdominal Pain Associated symptoms: no chills, no constipation, no diarrhea, no fever, no nausea and no vomiting     Past Medical History  Diagnosis Date  . Diabetes mellitus   . Hypertension    History reviewed. No pertinent past surgical history. No family history on file. Social History  Substance Use Topics  . Smoking status: Current Every Day Smoker  . Smokeless tobacco: None  . Alcohol Use: Yes     Comment: OCCASIONAL    Review of Systems  Constitutional: Negative for fever and chills.  HENT: Negative.   Respiratory: Negative.   Cardiovascular: Negative.   Gastrointestinal: Positive for abdominal pain. Negative for nausea, vomiting, diarrhea and constipation.  Musculoskeletal: Negative.   Skin: Negative.   Neurological: Negative.       Allergies  Review of patient's allergies indicates no known allergies.  Home Medications   Prior to Admission medications   Medication Sig Start Date End Date Taking? Authorizing Provider  amLODipine-valsartan (EXFORGE) 5-160 MG per tablet Take 1 tablet by mouth daily.      Historical Provider, MD  HYDROcodone-acetaminophen (NORCO/VICODIN) 5-325 MG per tablet Take 1-2 tablets by mouth every 4 (four) hours as needed. 06/21/15   Joycie Peek,  PA-C  ibuprofen (ADVIL,MOTRIN) 800 MG tablet Take 1 tablet (800 mg total) by mouth every 8 (eight) hours as needed for pain. 12/26/12   Bethann Berkshire, MD  methocarbamol (ROBAXIN) 500 MG tablet Take 1 tablet (500 mg total) by mouth 2 (two) times daily. 06/21/15   Joycie Peek, PA-C  naproxen (NAPROSYN) 500 MG tablet Take 1 tablet (500 mg total) by mouth 2 (two) times daily. 06/21/15   Joycie Peek, PA-C   BP 158/113 mmHg  Pulse 87  Temp(Src) 98.3 F (36.8 C) (Oral)  Resp 18  SpO2 92% Physical Exam  Constitutional: He is oriented to person, place, and time. He appears well-developed and well-nourished.  HENT:  Head: Normocephalic.  Neck: Normal range of motion. Neck supple.  Cardiovascular: Normal rate and regular rhythm.   Pulmonary/Chest: Effort normal and breath sounds normal. He has no wheezes. He has no rales.  Abdominal: Soft. Bowel sounds are normal. There is tenderness. There is no rebound and no guarding.  Mild lower abdominal tenderness.   Musculoskeletal: Normal range of motion.  Neurological: He is alert and oriented to person, place, and time.  Skin: Skin is warm and dry. No rash noted.  Psychiatric: He has a normal mood and affect.    ED Course  Procedures (including critical care time) Labs Review Labs Reviewed  COMPREHENSIVE METABOLIC PANEL - Abnormal; Notable for the following:    Albumin 3.3 (*)    All other components within normal limits  URINALYSIS, ROUTINE W REFLEX MICROSCOPIC (NOT AT [facility]) - Abnormal; Notable for the following:    Hgb urine dipstick  SMALL (*)    All other components within normal limits  URINE MICROSCOPIC-ADD ON - Abnormal; Notable for the following:    Squamous Epithelial / LPF 0-5 (*)    Bacteria, UA RARE (*)    All other components within normal limits  LIPASE, BLOOD  CBC    Imaging Review No results found. I have personally reviewed and evaluated these images and lab results as part of my medical decision-making.   EKG  Interpretation None      MDM   Final diagnoses:  None    1. Abdominal pain  Presents with lower abdominal pain x 2 weeks without other symptoms. He appears in NAD, VSS, afebrile. Plain film unremarkable, stool visualized. He can be discharged home - recommend Miralax and PCP follow up for recheck if pain continues.    Elpidio Anis, PA-C 12/26/15 0411  Gerhard Munch, MD 12/28/15 2352

## 2015-12-26 ENCOUNTER — Emergency Department (HOSPITAL_COMMUNITY): Payer: BLUE CROSS/BLUE SHIELD

## 2015-12-26 MED ORDER — POLYETHYLENE GLYCOL 3350 17 G PO PACK: 17.0000 g | PACK | Freq: Every day | ORAL | Status: DC

## 2015-12-26 NOTE — Discharge Instructions (Signed)

## 2015-12-28 ENCOUNTER — Encounter (HOSPITAL_COMMUNITY): Payer: Self-pay | Admitting: *Deleted

## 2015-12-28 ENCOUNTER — Emergency Department (INDEPENDENT_AMBULATORY_CARE_PROVIDER_SITE_OTHER)
Admission: EM | Admit: 2015-12-28 | Discharge: 2015-12-28 | Disposition: A | Discharge status: Home / Self Care | Payer: Worker's Compensation | Source: Home / Self Care | Attending: Family Medicine | Admitting: Family Medicine

## 2015-12-28 DIAGNOSIS — S86812A Strain of other muscle(s) and tendon(s) at lower leg level, left leg, initial encounter: Secondary | ICD-10-CM | POA: Diagnosis not present

## 2015-12-28 NOTE — Discharge Instructions (Signed)
Ice tonight then heat , advil, stretch as possible, activity as tolerated. See orthopedist if further problems.

## 2015-12-28 NOTE — ED Notes (Signed)
Pt  Reports    l  Calf  Pain    Pt  Reports   Felt  A  Pop      In  The    l   Lower  Leg      Today  The  Calf is  Swollen  And      Tender  To the  Touch             Pt    bp  Is  Elevated       He  Did  Not  Take  His  bp  Med   Today

## 2015-12-28 NOTE — ED Provider Notes (Signed)
CSN: [id number]     Arrival date & time 12/28/15  1644 History   First MD Initiated Contact with Patient 12/28/15 1819     Chief Complaint  Patient presents with  . Leg Pain   (Consider location/radiation/quality/duration/timing/severity/associated sxs/prior Treatment) Patient is a 45 y.o. male presenting with leg pain. The history is provided by the patient.  Leg Pain Location:  Leg Time since incident:  3 hours Injury: no   Leg location:  L lower leg Pain details:    Quality:  Sharp and tearing   Radiates to:  Does not radiate   Severity:  Moderate   Onset quality:  Sudden (reaching into cab of truck and felt sudden pop to left calf , pain with walking since.)   Progression:  Unchanged Chronicity:  New Dislocation: no   Prior injury to area:  No Relieved by:  None tried Worsened by:  Nothing tried Ineffective treatments:  None tried Associated symptoms: stiffness and swelling   Associated symptoms: no back pain and no numbness   Risk factors: obesity     Past Medical History  Diagnosis Date  . Diabetes mellitus   . Hypertension    History reviewed. No pertinent past surgical history. History reviewed. No pertinent family history. Social History  Substance Use Topics  . Smoking status: Current Every Day Smoker  . Smokeless tobacco: None  . Alcohol Use: Yes     Comment: OCCASIONAL    Review of Systems  Musculoskeletal: Positive for myalgias, gait problem and stiffness. Negative for back pain and joint swelling.  Skin: Negative.   All other systems reviewed and are negative.   Allergies  Review of patient's allergies indicates no known allergies.  Home Medications   Prior to Admission medications   Medication Sig Start Date End Date Taking? Authorizing Provider  amLODipine-valsartan (EXFORGE) 5-160 MG per tablet Take 1 tablet by mouth daily.      Historical Provider, MD  HYDROcodone-acetaminophen (NORCO/VICODIN) 5-325 MG per tablet Take 1-2 tablets by mouth  every 4 (four) hours as needed. Patient not taking: Reported on 12/25/2015 06/21/15   Joycie Peek, PA-C  ibuprofen (ADVIL,MOTRIN) 800 MG tablet Take 1 tablet (800 mg total) by mouth every 8 (eight) hours as needed for pain. Patient not taking: Reported on 12/25/2015 12/26/12   Bethann Berkshire, MD  methocarbamol (ROBAXIN) 500 MG tablet Take 1 tablet (500 mg total) by mouth 2 (two) times daily. Patient not taking: Reported on 12/25/2015 06/21/15   Joycie Peek, PA-C  naproxen (NAPROSYN) 500 MG tablet Take 1 tablet (500 mg total) by mouth 2 (two) times daily. Patient not taking: Reported on 12/25/2015 06/21/15   Joycie Peek, PA-C  polyethylene glycol [facility]) packet Take 17 g by mouth daily. 12/26/15   Elpidio Anis, PA-C   Meds Ordered and Administered this Visit  Medications - No data to display  BP 210/130 mmHg  Pulse 78  Temp(Src) 98.6 F (37 C) (Oral)  Resp 18  SpO2 100% No data found.   Physical Exam  Constitutional: He is oriented to person, place, and time. He appears well-developed and well-nourished.  Musculoskeletal: He exhibits tenderness.       Legs: Neurological: He is alert and oriented to person, place, and time.  Skin: Skin is warm and dry.  Nursing note and vitals reviewed.   ED Course  Procedures (including critical care time)  Labs Review Labs Reviewed - No data to display  Imaging Review No results found.   Visual Acuity Review  Right Eye Distance:   Left Eye Distance:   Bilateral Distance:    Right Eye Near:   Left Eye Near:    Bilateral Near:         MDM   1. Strain of calf muscle, left, initial encounter        Linna Hoff, MD 12/28/15 1850

## 2016-02-21 ENCOUNTER — Other Ambulatory Visit (HOSPITAL_COMMUNITY)
Admission: RE | Admit: 2016-02-21 | Discharge: 2016-02-21 | Disposition: A | Discharge status: Home / Self Care | Payer: BLUE CROSS/BLUE SHIELD | Source: Ambulatory Visit | Attending: Family Medicine | Admitting: Family Medicine

## 2016-02-21 DIAGNOSIS — Z113 Encounter for screening for infections with a predominantly sexual mode of transmission: Secondary | ICD-10-CM | POA: Diagnosis not present

## 2016-03-04 DIAGNOSIS — R369 Urethral discharge, unspecified: Secondary | ICD-10-CM | POA: Diagnosis not present

## 2016-03-16 ENCOUNTER — Ambulatory Visit (HOSPITAL_COMMUNITY)
Admission: EM | Admit: 2016-03-16 | Discharge: 2016-03-16 | Disposition: A | Discharge status: Home / Self Care | Payer: BLUE CROSS/BLUE SHIELD | Attending: Emergency Medicine | Admitting: Emergency Medicine

## 2016-03-16 ENCOUNTER — Encounter (HOSPITAL_COMMUNITY): Payer: Self-pay | Admitting: Emergency Medicine

## 2016-03-16 DIAGNOSIS — F17200 Nicotine dependence, unspecified, uncomplicated: Secondary | ICD-10-CM | POA: Insufficient documentation

## 2016-03-16 DIAGNOSIS — R109 Unspecified abdominal pain: Secondary | ICD-10-CM | POA: Insufficient documentation

## 2016-03-16 DIAGNOSIS — Z79899 Other long term (current) drug therapy: Secondary | ICD-10-CM | POA: Insufficient documentation

## 2016-03-16 DIAGNOSIS — N390 Urinary tract infection, site not specified: Secondary | ICD-10-CM | POA: Insufficient documentation

## 2016-03-16 DIAGNOSIS — E119 Type 2 diabetes mellitus without complications: Secondary | ICD-10-CM | POA: Insufficient documentation

## 2016-03-16 DIAGNOSIS — R300 Dysuria: Secondary | ICD-10-CM | POA: Insufficient documentation

## 2016-03-16 DIAGNOSIS — I10 Essential (primary) hypertension: Secondary | ICD-10-CM | POA: Insufficient documentation

## 2016-03-16 LAB — POCT URINALYSIS DIP (DEVICE)
BILIRUBIN URINE: NEGATIVE
GLUCOSE, UA: NEGATIVE mg/dL
HGB URINE DIPSTICK: NEGATIVE
Ketones, ur: NEGATIVE mg/dL
NITRITE: NEGATIVE
Protein, ur: NEGATIVE mg/dL
Specific Gravity, Urine: 1.025 (ref 1.005–1.030)
UROBILINOGEN UA: 0.2 mg/dL (ref 0.0–1.0)
pH: 7 (ref 5.0–8.0)

## 2016-03-16 MED ORDER — CIPROFLOXACIN HCL 500 MG PO TABS: 500.0000 mg | ORAL_TABLET | Freq: Two times a day (BID) | ORAL | Status: DC

## 2016-03-16 NOTE — ED Provider Notes (Signed)
CSN: [id number]     Arrival date & time 03/16/16  1924 History   First MD Initiated Contact with Patient 03/16/16 2019     Chief Complaint  Patient presents with  . Urinary Tract Infection   (Consider location/radiation/quality/duration/timing/severity/associated sxs/prior Treatment) HPI Comments: Patient presents with dysuria x 1 week. No hematuria. No penile discharge or lesions. History of trichomonas 1 month ago and was treated. No unprotected sex since that time. Mild right flank pain that comes and goes and he reports "hadn't thought that much about it". No N, V. No abdominal pain. No fever or chills.   Patient is a 45 y.o. male presenting with urinary tract infection. The history is provided by the patient.  Urinary Tract Infection    Past Medical History  Diagnosis Date  . Diabetes mellitus   . Hypertension    History reviewed. No pertinent past surgical history. No family history on file. Social History  Substance Use Topics  . Smoking status: Current Every Day Smoker  . Smokeless tobacco: None  . Alcohol Use: Yes     Comment: OCCASIONAL    Review of Systems  Constitutional: Negative for fever and chills.  Genitourinary: Positive for dysuria and flank pain. Negative for urgency, frequency, hematuria, decreased urine volume, discharge, penile swelling, penile pain and testicular pain.  Musculoskeletal: Negative.     Allergies  Review of patient's allergies indicates no known allergies.  Home Medications   Prior to Admission medications   Medication Sig Start Date End Date Taking? Authorizing Provider  amLODipine-valsartan (EXFORGE) 5-160 MG per tablet Take 1 tablet by mouth daily.      Historical Provider, MD  ciprofloxacin (CIPRO) 500 MG tablet Take 1 tablet (500 mg total) by mouth every 12 (twelve) hours. 03/16/16   Riki Sheer, PA-C  HYDROcodone-acetaminophen (NORCO/VICODIN) 5-325 MG per tablet Take 1-2 tablets by mouth every 4 (four) hours as  needed. Patient not taking: Reported on 12/25/2015 06/21/15   Joycie Peek, PA-C  ibuprofen (ADVIL,MOTRIN) 800 MG tablet Take 1 tablet (800 mg total) by mouth every 8 (eight) hours as needed for pain. Patient not taking: Reported on 12/25/2015 12/26/12   Bethann Berkshire, MD  methocarbamol (ROBAXIN) 500 MG tablet Take 1 tablet (500 mg total) by mouth 2 (two) times daily. Patient not taking: Reported on 12/25/2015 06/21/15   Joycie Peek, PA-C  naproxen (NAPROSYN) 500 MG tablet Take 1 tablet (500 mg total) by mouth 2 (two) times daily. Patient not taking: Reported on 12/25/2015 06/21/15   Joycie Peek, PA-C  polyethylene glycol [facility]) packet Take 17 g by mouth daily. 12/26/15   Elpidio Anis, PA-C   Meds Ordered and Administered this Visit  Medications - No data to display  BP 147/68 mmHg  Pulse 97  Temp(Src) 98.5 F (36.9 C) (Oral)  Resp 16  SpO2 99% No data found.   Physical Exam  Constitutional: He is oriented to person, place, and time. He appears well-developed and well-nourished. No distress.  Abdominal: Soft. There is no tenderness. There is no rebound and no guarding.  Neurological: He is alert and oriented to person, place, and time.  Skin: Skin is warm and dry. He is not diaphoretic.  Psychiatric: His behavior is normal.  Nursing note and vitals reviewed.   ED Course  Procedures (including critical care time)  Labs Review Labs Reviewed  POCT URINALYSIS DIP (DEVICE) - Abnormal; Notable for the following:    Leukocytes, UA TRACE (*)    All other components within normal  limits    Imaging Review No results found.   Visual Acuity Review  Right Eye Distance:   Left Eye Distance:   Bilateral Distance:    Right Eye Near:   Left Eye Near:    Bilateral Near:         MDM   1. UTI (lower urinary tract infection)    Reports no sexual encounters since treatment for STD 4 weeks ago. Urine with leuks, will cover with Cipro and check urine cytology. Call if  there is a change in treatment. Otherwise fluids and follow up if needed.     Riki SheerMichelle G Oleva Koo, PA-C 03/16/16 2051

## 2016-03-16 NOTE — ED Notes (Addendum)
History of uti, burning sensation with urination.  Feels pressure with urination pain for a week

## 2016-03-16 NOTE — Discharge Instructions (Signed)
Urinary Tract Infection Urinary tract infections (UTIs) can develop anywhere along your urinary tract. Your urinary tract is your body's drainage system for removing wastes and extra water. Your urinary tract includes two kidneys, two ureters, a bladder, and a urethra. Your kidneys are a pair of bean-shaped organs. Each kidney is about the size of your fist. They are located below your ribs, one on each side of your spine. CAUSES Infections are caused by microbes, which are microscopic organisms, including fungi, viruses, and bacteria. These organisms are so small that they can only be seen through a microscope. Bacteria are the microbes that most commonly cause UTIs. SYMPTOMS  Symptoms of UTIs may vary by age and gender of the patient and by the location of the infection. Symptoms in Reginald Gonzalez women typically include a frequent and intense urge to urinate and a painful, burning feeling in the bladder or urethra during urination. Older women and men are more likely to be tired, shaky, and weak and have muscle aches and abdominal pain. A fever may mean the infection is in your kidneys. Other symptoms of a kidney infection include pain in your back or sides below the ribs, nausea, and vomiting. DIAGNOSIS To diagnose a UTI, your caregiver will ask you about your symptoms. Your caregiver will also ask you to provide a urine sample. The urine sample will be tested for bacteria and white blood cells. White blood cells are made by your body to help fight infection. TREATMENT  Typically, UTIs can be treated with medication. Because most UTIs are caused by a bacterial infection, they usually can be treated with the use of antibiotics. The choice of antibiotic and length of treatment depend on your symptoms and the type of bacteria causing your infection. HOME CARE INSTRUCTIONS  If you were prescribed antibiotics, take them exactly as your caregiver instructs you. Finish the medication even if you feel better after  you have only taken some of the medication.  Drink enough water and fluids to keep your urine clear or pale yellow.  Avoid caffeine, tea, and carbonated beverages. They tend to irritate your bladder.  Empty your bladder often. Avoid holding urine for long periods of time.  Empty your bladder before and after sexual intercourse.  After a bowel movement, women should cleanse from front to back. Use each tissue only once. SEEK MEDICAL CARE IF:   You have back pain.  You develop a fever.  Your symptoms do not begin to resolve within 3 days. SEEK IMMEDIATE MEDICAL CARE IF:   You have severe back pain or lower abdominal pain.  You develop chills.  You have nausea or vomiting.  You have continued burning or discomfort with urination. MAKE SURE YOU:   Understand these instructions.  Will watch your condition.  Will get help right away if you are not doing well or get worse.   This information is not intended to replace advice given to you by your health care provider. Make sure you discuss any questions you have with your health care provider.   Drink plenty of fluids. If any positive results from urine we will call you.    Document Released: 08/22/2005 Document Revised: 08/03/2015 Document Reviewed: 12/21/2011 Elsevier Interactive Patient Education [employer]2016 [employer]vier Inc.

## 2016-03-19 LAB — URINE CYTOLOGY ANCILLARY ONLY
Chlamydia: NEGATIVE
NEISSERIA GONORRHEA: NEGATIVE
TRICH (WINDOWPATH): NEGATIVE

## 2016-04-13 DIAGNOSIS — N342 Other urethritis: Secondary | ICD-10-CM | POA: Diagnosis not present

## 2016-04-13 DIAGNOSIS — N343 Urethral syndrome, unspecified: Secondary | ICD-10-CM | POA: Diagnosis not present

## 2016-08-10 ENCOUNTER — Ambulatory Visit (HOSPITAL_COMMUNITY)
Admission: EM | Admit: 2016-08-10 | Discharge: 2016-08-10 | Disposition: A | Discharge status: Home / Self Care | Payer: BLUE CROSS/BLUE SHIELD | Attending: Family Medicine | Admitting: Family Medicine

## 2016-08-10 ENCOUNTER — Encounter (HOSPITAL_COMMUNITY): Payer: Self-pay | Admitting: *Deleted

## 2016-08-10 DIAGNOSIS — Z79899 Other long term (current) drug therapy: Secondary | ICD-10-CM | POA: Insufficient documentation

## 2016-08-10 DIAGNOSIS — R300 Dysuria: Secondary | ICD-10-CM | POA: Diagnosis present

## 2016-08-10 DIAGNOSIS — N342 Other urethritis: Secondary | ICD-10-CM

## 2016-08-10 DIAGNOSIS — F17200 Nicotine dependence, unspecified, uncomplicated: Secondary | ICD-10-CM | POA: Diagnosis not present

## 2016-08-10 LAB — POCT URINALYSIS DIP (DEVICE)
BILIRUBIN URINE: NEGATIVE
Glucose, UA: NEGATIVE mg/dL
Ketones, ur: NEGATIVE mg/dL
NITRITE: NEGATIVE
PH: 6 (ref 5.0–8.0)
PROTEIN: NEGATIVE mg/dL
Specific Gravity, Urine: >=1.03 (ref 1.005–1.030)
Urobilinogen, UA: 0.2 mg/dL (ref 0.0–1.0)

## 2016-08-10 MED ORDER — CIPROFLOXACIN HCL 500 MG PO TABS: 500.0000 mg | ORAL_TABLET | Freq: Two times a day (BID) | ORAL | 0 refills | Status: AC

## 2016-08-10 NOTE — ED Triage Notes (Signed)
C/O dysuria, polyuria x approx 4 wks without abd pain, flank pain, or back pain.

## 2016-08-10 NOTE — Discharge Instructions (Signed)
Start Cipro twice a day as directed. Increase water intake. Decrease sugar in drinks. Recommend follow-up with your primary care provider if symptoms are not improving in 3 days.

## 2016-08-10 NOTE — ED Notes (Signed)
Call back number verified and updated in EPIC... Adv pt to not have SI until lab results comeback neg.... Also adv pt lab results will be on MyChart; instructions given .... Pt verb understanding.   

## 2016-08-11 NOTE — ED Provider Notes (Signed)
CSN: [id number]     Arrival date & time 08/10/16  1939 History   None    Chief Complaint  Patient presents with  . Dysuria   (Consider location/radiation/quality/duration/timing/severity/associated sxs/prior Treatment) 45 year old male presents with dysuria, increased frequency for the past month. He denies any fever, back pain, abdominal pain, penile discharge or pain. 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 He is married and has had no change in partner since last tested for STD's here in April 2017. He is borderline diabetic and controls his sugars through diet. He had similar symptoms in April and was treated with Cipro. No urine culture done at that time.    The history is provided by the patient.    Past Medical History:  Diagnosis Date  . Diabetes mellitus   . Hypertension    History reviewed. No pertinent surgical history. No family history on file. Social History  Substance Use Topics  . Smoking status: Current Every Day Smoker  . Smokeless tobacco: Not on file  . Alcohol use Yes     Comment: occasional    Review of Systems  Constitutional: Negative for chills, fatigue and fever.  Respiratory: Negative for shortness of breath.   Cardiovascular: Negative for chest pain.  Gastrointestinal: Negative for abdominal pain, blood in stool, diarrhea, nausea and vomiting.  Genitourinary: Positive for dysuria and frequency. Negative for decreased urine volume, difficulty urinating, discharge, flank pain, genital sores, hematuria, penile pain, penile swelling, scrotal swelling, testicular pain and urgency.  Skin: Negative for rash.  Neurological: Negative for weakness and headaches.    Allergies  Review of patient's allergies indicates no known allergies.  Home Medications   Prior to Admission medications   Medication Sig Start Date End Date Taking? Authorizing Provider  amLODipine-valsartan (EXFORGE) 5-160 MG per tablet Take 1 tablet by mouth daily.      Yes Historical Provider, MD  ciprofloxacin (CIPRO) 500 MG tablet Take 1 tablet (500 mg total) by mouth 2 (two) times daily. 08/10/16 08/20/16  Sudie GrumblingAnn Berry Dwayna Kentner, NP   Meds Ordered and Administered this Visit  Medications - No data to display  BP 149/89 (BP Location: Left Arm)   Pulse 91   Temp 98.3 F (36.8 C) (Oral)   Resp 16   SpO2 98%  No data found.   Physical Exam  Constitutional: He is oriented to person, place, and time. He appears well-developed and well-nourished. No distress.  Cardiovascular: Normal rate, regular rhythm and normal heart sounds.   Pulmonary/Chest: Effort normal and breath sounds normal.  Abdominal: Soft. Bowel sounds are normal. He exhibits no distension and no mass. There is no tenderness. There is no guarding and no CVA tenderness.  Neurological: He is alert and oriented to person, place, and time.  Skin: Skin is warm and dry. No rash noted.  Psychiatric: He has a normal mood and affect. His behavior is normal. Judgment and thought content normal.    Urgent Care Course   Clinical Course    Procedures (including critical care time)  Labs Review Labs Reviewed  POCT URINALYSIS DIP (DEVICE) - Abnormal; Notable for the following:       Result Value   Hgb urine dipstick SMALL (*)    Leukocytes, UA SMALL (*)    All other components within normal limits  URINE CULTURE    Imaging Review No results found.   Visual Acuity Review  Right Eye Distance:   Left Eye Distance:   Bilateral  Distance:    Right Eye Near:   Left Eye Near:    Bilateral Near:         MDM   1. Urethritis    Discussed urinalysis results with patient. Possible infection due to [facility] and blood in urine. Start Cipro 500mg  twice a day for 10 days. Urine sent for culture. Discussed decreasing sugar-containing drinks and limit caffeine. Increase water intake. Discussed that further evaluation may be needed with a Urologist. Recommend follow-up pending urine culture results  and his PCP in 3 to 4 days if not improving.     Sudie Grumbling, NP 08/11/16 1734

## 2016-08-12 LAB — URINE CULTURE: SPECIAL REQUESTS: NORMAL

## 2016-08-15 ENCOUNTER — Telehealth (HOSPITAL_COMMUNITY): Payer: Self-pay | Admitting: Emergency Medicine

## 2016-08-15 NOTE — Telephone Encounter (Signed)
LM on pt's VM [phone] Need to see how pt is doing and to give lab results from recent visit on 9/15

## 2016-08-15 NOTE — Telephone Encounter (Signed)
Pt called back  Pt ID'd properly... Reports feeling better and sx have subsided Adv pt if sx are not getting better to return or to f/u w/PCP Pt verb understanding.

## 2016-08-15 NOTE — Telephone Encounter (Signed)
-----   Message from Eustace MooreLaura W Murray, MD sent at 08/14/2016  2:33 PM EDT ----- Please let patient know that urine culture does not suggest UTI.  Might benefit from followup with urologist, as discussed during UC visit 08/10/16, for further evaluation if urinary symptoms persist.  LM

## 2016-12-06 DIAGNOSIS — M79602 Pain in left arm: Secondary | ICD-10-CM | POA: Diagnosis not present

## 2016-12-06 DIAGNOSIS — I10 Essential (primary) hypertension: Secondary | ICD-10-CM | POA: Diagnosis not present

## 2016-12-06 DIAGNOSIS — Z23 Encounter for immunization: Secondary | ICD-10-CM | POA: Diagnosis not present

## 2017-03-08 ENCOUNTER — Encounter (HOSPITAL_COMMUNITY): Payer: Self-pay | Admitting: Emergency Medicine

## 2017-03-08 ENCOUNTER — Ambulatory Visit (HOSPITAL_COMMUNITY)
Admission: EM | Admit: 2017-03-08 | Discharge: 2017-03-08 | Disposition: A | Discharge status: Home / Self Care | Payer: BLUE CROSS/BLUE SHIELD | Attending: Family Medicine | Admitting: Family Medicine

## 2017-03-08 DIAGNOSIS — Z87891 Personal history of nicotine dependence: Secondary | ICD-10-CM | POA: Insufficient documentation

## 2017-03-08 DIAGNOSIS — R369 Urethral discharge, unspecified: Secondary | ICD-10-CM | POA: Diagnosis not present

## 2017-03-08 DIAGNOSIS — R300 Dysuria: Secondary | ICD-10-CM

## 2017-03-08 DIAGNOSIS — Z79899 Other long term (current) drug therapy: Secondary | ICD-10-CM | POA: Diagnosis not present

## 2017-03-08 DIAGNOSIS — E119 Type 2 diabetes mellitus without complications: Secondary | ICD-10-CM | POA: Insufficient documentation

## 2017-03-08 DIAGNOSIS — I10 Essential (primary) hypertension: Secondary | ICD-10-CM | POA: Insufficient documentation

## 2017-03-08 DIAGNOSIS — Z113 Encounter for screening for infections with a predominantly sexual mode of transmission: Secondary | ICD-10-CM

## 2017-03-08 DIAGNOSIS — R319 Hematuria, unspecified: Secondary | ICD-10-CM | POA: Diagnosis not present

## 2017-03-08 LAB — POCT URINALYSIS DIP (DEVICE)
Bilirubin Urine: NEGATIVE
GLUCOSE, UA: NEGATIVE mg/dL
Ketones, ur: NEGATIVE mg/dL
Leukocytes, UA: NEGATIVE
NITRITE: NEGATIVE
PH: 7 (ref 5.0–8.0)
PROTEIN: NEGATIVE mg/dL
Specific Gravity, Urine: 1.025 (ref 1.005–1.030)
UROBILINOGEN UA: 1 mg/dL (ref 0.0–1.0)

## 2017-03-08 MED ORDER — LIDOCAINE HCL (PF) 1 % IJ SOLN
INTRAMUSCULAR | Status: AC
  Filled 2017-03-08: qty 2

## 2017-03-08 MED ORDER — AZITHROMYCIN 250 MG PO TABS
1000.0000 mg | ORAL_TABLET | Freq: Once | ORAL | Status: AC
  Administered 2017-03-08: 1000 mg via ORAL

## 2017-03-08 MED ORDER — CEFTRIAXONE SODIUM 250 MG IJ SOLR
INTRAMUSCULAR | Status: AC
  Filled 2017-03-08: qty 250

## 2017-03-08 MED ORDER — AZITHROMYCIN 250 MG PO TABS
ORAL_TABLET | ORAL | Status: AC
  Filled 2017-03-08: qty 4

## 2017-03-08 MED ORDER — CEFTRIAXONE SODIUM 250 MG IJ SOLR
250.0000 mg | Freq: Once | INTRAMUSCULAR | Status: AC
  Administered 2017-03-08: 250 mg via INTRAMUSCULAR

## 2017-03-08 NOTE — ED Provider Notes (Signed)
CSN: [id number]     Arrival date & time 03/08/17  1052 History   None    Chief Complaint  Patient presents with  . Urinary Tract Infection   (Consider location/radiation/quality/duration/timing/severity/associated sxs/prior Treatment) Patient c/o burning with urination for over a week and c/o urethral DC.  He has had recent unprotected sex.   The history is provided by the patient.  Urinary Tract Infection  This is a new problem. The problem occurs constantly. The problem has not changed since onset.   Past Medical History:  Diagnosis Date  . Diabetes mellitus   . Hypertension    History reviewed. No pertinent surgical history. No family history on file. Social History  Substance Use Topics  . Smoking status: Former Games developer  . Smokeless tobacco: Never Used  . Alcohol use Yes     Comment: occasional    Review of Systems  Constitutional: Negative.   HENT: Negative.   Eyes: Negative.   Respiratory: Negative.   Cardiovascular: Negative.   Endocrine: Negative.   Genitourinary: Positive for discharge and dysuria.  Musculoskeletal: Negative.   Allergic/Immunologic: Negative.   Neurological: Negative.   Hematological: Negative.   Psychiatric/Behavioral: Negative.     Allergies  Patient has no known allergies.  Home Medications   Prior to Admission medications   Medication Sig Start Date End Date Taking? Authorizing Provider  amLODipine-valsartan (EXFORGE) 5-160 MG per tablet Take 1 tablet by mouth daily.     Yes Historical Provider, MD   Meds Ordered and Administered this Visit   Medications  azithromycin (ZITHROMAX) tablet 1,000 mg (1,000 mg Oral Given 03/08/17 1240)  cefTRIAXone (ROCEPHIN) injection 250 mg (250 mg Intramuscular Given 03/08/17 1240)    BP (!) 186/106 (BP Location: Right Arm)   Pulse 95   Temp 98.6 F (37 C) (Oral)   Resp 16   Ht  (1.6 m)   Wt 265 lb (120.2 kg)   SpO2 100%   BMI 46.94 kg/m  No data found.   Physical Exam   Constitutional: He is oriented to person, place, and time. He appears well-developed and well-nourished.  HENT:  Head: Normocephalic.  Right Ear: External ear normal.  Left Ear: External ear normal.  Mouth/Throat: Oropharynx is clear and moist.  Eyes: Conjunctivae and EOM are normal. Pupils are equal, round, and reactive to light.  Neck: Normal range of motion. Neck supple.  Pulmonary/Chest: Effort normal and breath sounds normal.  Genitourinary: Penile tenderness present.  Neurological: He is alert and oriented to person, place, and time.  Nursing note and vitals reviewed.   Urgent Care Course     Procedures (including critical care time)  Labs Review Labs Reviewed  POCT URINALYSIS DIP (DEVICE) - Abnormal; Notable for the following:       Result Value   Hgb urine dipstick SMALL (*)    All other components within normal limits  URINE CULTURE  URINE CYTOLOGY ANCILLARY ONLY    Imaging Review No results found.   Visual Acuity Review  Right Eye Distance:   Left Eye Distance:   Bilateral Distance:    Right Eye Near:   Left Eye Near:    Bilateral Near:         MDM   1. Dysuria   2. Hematuria, unspecified type   3. Urethral discharge in male   4. Screening for STDs (sexually transmitted diseases)    Urine cytology - GC Chlamydia Trich Zithromax  x 4 Rocephin  IM  Deatra Canter, FNP 03/08/17 2041

## 2017-03-08 NOTE — ED Triage Notes (Signed)
PT reports burning with urination for over a week. PT was treated for a UTI a few months ago. PT reports clear discharge. PT is unsure whether or not he has been exposed to STDs.

## 2017-03-08 NOTE — ED Notes (Signed)
Call back number verified and updated in EPIC... Adv pt to not have SI until lab results comeback neg.... Also adv pt lab results will be on MyChart; instructions given .... Pt verb understanding.   

## 2017-03-08 NOTE — ED Triage Notes (Signed)
Denies abdominal pain.

## 2017-03-08 NOTE — ED Notes (Signed)
Clean and Dirty urine specimens obtained. Specimens are in the lab

## 2017-03-09 LAB — URINE CULTURE: Culture: NO GROWTH

## 2017-03-11 LAB — URINE CYTOLOGY ANCILLARY ONLY
Chlamydia: NEGATIVE
Neisseria Gonorrhea: NEGATIVE
Trichomonas: NEGATIVE

## 2017-04-03 ENCOUNTER — Encounter (HOSPITAL_COMMUNITY): Payer: Self-pay | Admitting: Emergency Medicine

## 2017-04-03 ENCOUNTER — Ambulatory Visit (HOSPITAL_COMMUNITY)
Admission: EM | Admit: 2017-04-03 | Discharge: 2017-04-03 | Disposition: A | Discharge status: Home / Self Care | Payer: BLUE CROSS/BLUE SHIELD | Attending: Internal Medicine | Admitting: Internal Medicine

## 2017-04-03 DIAGNOSIS — M5412 Radiculopathy, cervical region: Secondary | ICD-10-CM

## 2017-04-03 MED ORDER — METAXALONE 800 MG PO TABS: 800.0000 mg | ORAL_TABLET | Freq: Three times a day (TID) | ORAL | 0 refills | Status: DC

## 2017-04-03 MED ORDER — NAPROXEN 500 MG PO TABS: 500.0000 mg | ORAL_TABLET | Freq: Two times a day (BID) | ORAL | 0 refills | Status: DC

## 2017-04-03 MED ORDER — PREDNISONE 50 MG PO TABS: 50.0000 mg | ORAL_TABLET | Freq: Every day | ORAL | 0 refills | Status: AC

## 2017-04-03 NOTE — ED Provider Notes (Signed)
[facility]    CSN: [id number] Arrival date & time: 04/03/17  1457     History   Chief Complaint Chief Complaint  Patient presents with  . Back Pain    HPI Waynetta SandyJames T Monk is a 46 y.o. male. He was watching the game on Monday, on an unfamiliar bed, propped up, and now has some back pain around the left shoulder blade. This is sometimes worse with deep breath, and definitely worse with torso motions and neck motions. Last night he took ibuprofen and used some icy hot, without significant relief. No weakness/clumsiness in the arms or legs, no loss of sensation, no change in bowel or bladder function. No previous history of this symptom.    HPI  Past Medical History:  Diagnosis Date  . Diabetes mellitus   . Hypertension     Patient Active Problem List   Diagnosis Date Noted  . Diabetes mellitus 10/21/2011  . HTN (hypertension) 10/21/2011    History reviewed. No pertinent surgical history.     Home Medications    Prior to Admission medications   Medication Sig Start Date End Date Taking? Authorizing Provider  amLODipine-valsartan (EXFORGE) 5-160 MG per tablet Take 1 tablet by mouth daily.     Yes [provider]  metaxalone (SKELAXIN) 800 MG tablet Take 1 tablet (800 mg total) by mouth 3 (three) times daily. 04/03/17   Eustace MooreMurray, Nollie Shiflett W, MD  naproxen (NAPROSYN) 500 MG tablet Take 1 tablet (500 mg total) by mouth 2 (two) times daily. 04/03/17   Eustace MooreMurray, Jahki Witham W, MD  predniSONE (DELTASONE) 50 MG tablet Take 1 tablet (50 mg total) by mouth daily. 04/03/17 04/08/17  Eustace MooreMurray, Leighanne Adolph W, MD    Family History No family history on file.  Social History Social History  Substance Use Topics  . Smoking status: Former Games developermoker  . Smokeless tobacco: Never Used  . Alcohol use Yes     Comment: occasional     Allergies   Patient has no known allergies.   Review of Systems Review of Systems  All other systems reviewed and are negative.    Physical Exam Triage  Vital Signs ED Triage Vitals  Enc Vitals Group     BP 04/03/17 1511 (!) 146/96     Pulse Rate 04/03/17 1511 99     Resp 04/03/17 1511 (!) 24     Temp 04/03/17 1511 98.8 F (37.1 C)     Temp Source 04/03/17 1511 Oral     SpO2 04/03/17 1511 97 %     Weight --      Height --      Pain Score 04/03/17 1509 10     Pain Loc --    Updated Vital Signs BP (!) 146/96 (BP Location: Right Arm) Comment: large cuff.  has not had htn medicine today  Pulse 99   Temp 98.8 F (37.1 C) (Oral)   Resp (!) 24   SpO2 97%   Physical Exam  Constitutional: He is oriented to person, place, and time. No distress.  Alert, nicely groomed  HENT:  Head: Atraumatic.  Eyes:  Conjugate gaze, no eye redness/drainage  Neck: Neck supple.  Cardiovascular: Normal rate.   Pulmonary/Chest: No respiratory distress.  Abdominal: He exhibits no distension.  Musculoskeletal: Normal range of motion.  Moves a little bit stiffly but able to climb on/off the exam table. No tenderness to palpation, no focal bruising/swelling/rash/erythema.  Neurological: He is alert and oriented to person, place, and time.  Skin:  Skin is warm and dry.  No cyanosis  Nursing note and vitals reviewed.    UC Treatments / Results   Procedures Procedures (including critical care time) None today  Final Clinical Impressions(s) / UC Diagnoses   Final diagnoses:  Cervical radiculopathy, acute   Anticipate gradual improvement in back/neck discomfort over the next week or two.  Prescriptions for naproxen (anti inflammatory/pain reliever), metaxalone (muscle relaxer), and prednisone (steroid) were sent to the [facility] on [address].  Ice for 5-10 minutes several times daily may also help with discomfort.  Physical therapy may be helpful.  Limit activities that markedly increase pain.  Followup with primary care provider or sports med/orthopedics if not improving in a couple weeks.  New Prescriptions New Prescriptions   METAXALONE (SKELAXIN) 800  MG TABLET    Take 1 tablet (800 mg total) by mouth 3 (three) times daily.   NAPROXEN (NAPROSYN) 500 MG TABLET    Take 1 tablet (500 mg total) by mouth 2 (two) times daily.   PREDNISONE (DELTASONE) 50 MG TABLET    Take 1 tablet (50 mg total) by mouth daily.     Eustace Moore, MD 04/07/17 [phone]

## 2017-04-03 NOTE — ED Triage Notes (Signed)
Onset Monday evening of back pain.  Patient reports left mid-back pain.  Pain makes patient catch breath.  Pain is sharp.  Movement makes pain worse.

## 2017-04-03 NOTE — Discharge Instructions (Addendum)
Anticipate gradual improvement in back/neck discomfort over the next week or two.  Prescriptions for naproxen (anti inflammatory/pain reliever), metaxalone (muscle relaxer), and prednisone (steroid) were sent to the [facility] on [address].  Ice for 5-10 minutes several times daily may also help with discomfort.  Physical therapy may be helpful.  Limit activities that markedly increase pain.  Followup with primary care provider or sports med/orthopedics if not improving in a couple weeks.

## 2017-06-10 DIAGNOSIS — E118 Type 2 diabetes mellitus with unspecified complications: Secondary | ICD-10-CM | POA: Diagnosis not present

## 2017-06-10 DIAGNOSIS — E669 Obesity, unspecified: Secondary | ICD-10-CM | POA: Diagnosis not present

## 2017-06-10 DIAGNOSIS — I10 Essential (primary) hypertension: Secondary | ICD-10-CM | POA: Diagnosis not present

## 2017-06-10 DIAGNOSIS — R21 Rash and other nonspecific skin eruption: Secondary | ICD-10-CM | POA: Diagnosis not present

## 2017-09-02 DIAGNOSIS — E782 Mixed hyperlipidemia: Secondary | ICD-10-CM | POA: Diagnosis not present

## 2017-09-02 DIAGNOSIS — Z125 Encounter for screening for malignant neoplasm of prostate: Secondary | ICD-10-CM | POA: Diagnosis not present

## 2017-09-02 DIAGNOSIS — I10 Essential (primary) hypertension: Secondary | ICD-10-CM | POA: Diagnosis not present

## 2017-09-02 DIAGNOSIS — Z23 Encounter for immunization: Secondary | ICD-10-CM | POA: Diagnosis not present

## 2017-09-02 DIAGNOSIS — E118 Type 2 diabetes mellitus with unspecified complications: Secondary | ICD-10-CM | POA: Diagnosis not present

## 2017-10-25 DIAGNOSIS — N399 Disorder of urinary system, unspecified: Secondary | ICD-10-CM | POA: Diagnosis not present

## 2017-10-25 DIAGNOSIS — N390 Urinary tract infection, site not specified: Secondary | ICD-10-CM | POA: Diagnosis not present

## 2017-10-25 DIAGNOSIS — E6609 Other obesity due to excess calories: Secondary | ICD-10-CM | POA: Diagnosis not present

## 2017-10-25 DIAGNOSIS — I10 Essential (primary) hypertension: Secondary | ICD-10-CM | POA: Diagnosis not present

## 2018-05-31 ENCOUNTER — Encounter (HOSPITAL_COMMUNITY): Payer: Self-pay | Admitting: Emergency Medicine

## 2018-05-31 ENCOUNTER — Ambulatory Visit (HOSPITAL_COMMUNITY)
Admission: EM | Admit: 2018-05-31 | Discharge: 2018-05-31 | Disposition: A | Discharge status: Home / Self Care | Payer: BLUE CROSS/BLUE SHIELD | Attending: Family Medicine | Admitting: Family Medicine

## 2018-05-31 ENCOUNTER — Other Ambulatory Visit: Payer: Self-pay

## 2018-05-31 DIAGNOSIS — R300 Dysuria: Secondary | ICD-10-CM

## 2018-05-31 LAB — POCT URINALYSIS DIP (DEVICE)
BILIRUBIN URINE: NEGATIVE
Glucose, UA: NEGATIVE mg/dL
KETONES UR: NEGATIVE mg/dL
LEUKOCYTES UA: NEGATIVE
NITRITE: NEGATIVE
PH: 6 (ref 5.0–8.0)
Protein, ur: NEGATIVE mg/dL
Specific Gravity, Urine: >=1.03 (ref 1.005–1.030)
Urobilinogen, UA: 0.2 mg/dL (ref 0.0–1.0)

## 2018-05-31 MED ORDER — URIBEL 118 MG PO CAPS: 118.0000 mg | ORAL_CAPSULE | Freq: Four times a day (QID) | ORAL | 0 refills | Status: DC

## 2018-05-31 NOTE — ED Provider Notes (Signed)
Chief Complaint  Patient presents with  . Dysuria    Reginald Gonzalez is a 47 y.o. male here for possible UTI.  Duration: 10 days. Symptoms: dysuria Denies: urinary frequency, hematuria, urinary hesitancy, urinary retention, fever, nausea, vomiting, urinary incontinence, insertive anal intercourse, discharge and flank pain Hx of recurrent UTI? No Denies new sexual partners.  ROS:  Constitutional: denies fever GU: As noted in HPI  Past Medical History:  Diagnosis Date  . Diabetes mellitus   . Hypertension     BP (!) 141/75 (BP Location: Left Arm)   Pulse (!) 102   Temp 98.1 F (36.7 C) (Oral)   Resp 20   SpO2 96%  General: Awake, alert, appears stated age Heart: RRR Lungs: CTAB, normal respiratory effort, no accessory muscle usage Abd: BS+, soft, NT, ND, no masses or organomegaly MSK: No CVA tenderness, neg Lloyd's sign Psych: Age appropriate judgment and insight   Final diagnoses:  Dysuria     Discharge Instructions     Do not spend more than $25 on this prescription.   Azo (pyridium) is available over the counter and is a cheaper alternative.  If no improvement, follow up with your PCP and you may need a urology referral.   No news is good news regarding your urine culture.     ED Prescriptions    Medication Sig Dispense Auth. Provider   Meth-Hyo-M Bl-Na Phos-Ph Sal (URIBEL) 118 MG CAPS Take 1 capsule (118 mg total) by mouth 4 (four) times daily. 120 capsule Sharlene DoryWendling, Breydon [place], DO        Arva ChafeWendling, Annick Dimaio [place], [place] 05/31/18 [id number]

## 2018-05-31 NOTE — Discharge Instructions (Signed)
Do not spend more than $25 on this prescription.   Azo (pyridium) is available over the counter and is a cheaper alternative.  If no improvement, follow up with your PCP and you may need a urology referral.   No news is good news regarding your urine culture.

## 2018-05-31 NOTE — ED Triage Notes (Signed)
[facility] a [facility] dysuria x 10 days. Dirty and clean urine collected.

## 2018-09-23 ENCOUNTER — Other Ambulatory Visit: Payer: Self-pay

## 2018-09-23 DIAGNOSIS — R109 Unspecified abdominal pain: Secondary | ICD-10-CM

## 2018-09-29 ENCOUNTER — Other Ambulatory Visit: Payer: Self-pay

## 2018-11-22 ENCOUNTER — Other Ambulatory Visit: Payer: Self-pay

## 2018-11-22 ENCOUNTER — Ambulatory Visit (HOSPITAL_COMMUNITY)
Admission: EM | Admit: 2018-11-22 | Discharge: 2018-11-22 | Disposition: A | Discharge status: Home / Self Care | Payer: PRIVATE HEALTH INSURANCE | Attending: Family Medicine | Admitting: Family Medicine

## 2018-11-22 ENCOUNTER — Encounter (HOSPITAL_COMMUNITY): Payer: Self-pay | Admitting: Emergency Medicine

## 2018-11-22 DIAGNOSIS — E119 Type 2 diabetes mellitus without complications: Secondary | ICD-10-CM | POA: Diagnosis not present

## 2018-11-22 DIAGNOSIS — I10 Essential (primary) hypertension: Secondary | ICD-10-CM

## 2018-11-22 DIAGNOSIS — R300 Dysuria: Secondary | ICD-10-CM | POA: Diagnosis not present

## 2018-11-22 DIAGNOSIS — Z79899 Other long term (current) drug therapy: Secondary | ICD-10-CM | POA: Insufficient documentation

## 2018-11-22 DIAGNOSIS — Z87891 Personal history of nicotine dependence: Secondary | ICD-10-CM | POA: Diagnosis not present

## 2018-11-22 LAB — POCT URINALYSIS DIP (DEVICE)
Bilirubin Urine: NEGATIVE
Glucose, UA: NEGATIVE mg/dL
Ketones, ur: NEGATIVE mg/dL
Leukocytes, UA: NEGATIVE
Nitrite: NEGATIVE
Protein, ur: NEGATIVE mg/dL
Specific Gravity, Urine: 1.025 (ref 1.005–1.030)
Urobilinogen, UA: 0.2 mg/dL (ref 0.0–1.0)
pH: 6 (ref 5.0–8.0)

## 2018-11-22 NOTE — ED Triage Notes (Signed)
[facility] a [facility] 1 week.

## 2018-11-22 NOTE — ED Notes (Signed)
Dirty and clean urine sample collected.

## 2018-11-22 NOTE — Discharge Instructions (Addendum)
It was nice seeing you today. You urine test does not look infected. However, as discussed with you, we will go ahead and send it for culture. STD screen was not done today since you endorsed no chance of STD exposure. Your BP is slightly elevated today. Please continue your current medication and see your PCP soon for adjustment.

## 2018-11-22 NOTE — ED Provider Notes (Signed)
[facility]    CSN: [id number] Arrival date & time: 11/22/18  1449     History   Chief Complaint Chief Complaint  Patient presents with  . Dysuria    HPI Waynetta SandyJames T Lafavor is a 47 y.o. male.   The history is provided by the patient. No language interpreter was used.  Dysuria  This is a new problem. Episode onset: 1.5 week ago. The problem occurs constantly. The problem has been gradually worsening. Pertinent negatives include no chest pain, no abdominal pain, no headaches and no shortness of breath. Associated symptoms comments: Burning with urination, no change in his urine color. Denies penile discharge. No STD concern.. Nothing aggravates the symptoms. Nothing relieves the symptoms.  Hypertension  This is a chronic problem. Pertinent negatives include no chest pain, no abdominal pain, no headaches and no shortness of breath. Relieved by: On Exforge which he took about 2 hours ago.    Past Medical History:  Diagnosis Date  . Diabetes mellitus   . Hypertension     Patient Active Problem List   Diagnosis Date Noted  . Diabetes mellitus 10/21/2011  . HTN (hypertension) 10/21/2011    History reviewed. No pertinent surgical history.     Home Medications    Prior to Admission medications   Medication Sig Start Date End Date Taking? Authorizing Provider  amLODipine-valsartan (EXFORGE) 5-160 MG per tablet Take 1 tablet by mouth daily.     Yes [provider]    Family History History reviewed. No pertinent family history.  Social History Social History   Tobacco Use  . Smoking status: Former Games developermoker  . Smokeless tobacco: Never Used  Substance Use Topics  . Alcohol use: Yes    Comment: occasional  . Drug use: No     Allergies   Patient has no known allergies.   Review of Systems Review of Systems  Constitutional: Negative.   Respiratory: Negative.  Negative for shortness of breath.   Cardiovascular: Negative.  Negative for chest  pain.  Gastrointestinal: Negative.  Negative for abdominal pain.  Genitourinary: Positive for dysuria.  Musculoskeletal: Negative.   Neurological: Negative.  Negative for headaches.  All other systems reviewed and are negative.    Physical Exam Triage Vital Signs ED Triage Vitals  Enc Vitals Group     BP 11/22/18 1543 (!) 141/88     Pulse Rate 11/22/18 1543 97     Resp 11/22/18 1543 18     Temp 11/22/18 1543 98.4 F (36.9 C)     Temp Source 11/22/18 1543 Oral     SpO2 11/22/18 1543 96 %     Weight --      Height --      Head Circumference --      Peak Flow --      Pain Score 11/22/18 1541 8     Pain Loc --      Pain Edu? --      Excl. in [place]? --    No data found.  Updated Vital Signs BP (!) 141/88 (BP Location: Left Arm)   Pulse 97   Temp 98.4 F (36.9 C) (Oral)   Resp 18   SpO2 96%   Visual Acuity Right Eye Distance:   Left Eye Distance:   Bilateral Distance:    Right Eye Near:   Left Eye Near:    Bilateral Near:     Physical Exam Vitals signs and nursing note reviewed.  Constitutional:  General: He is not in acute distress.    Appearance: He is not ill-appearing.  Cardiovascular:     Rate and Rhythm: Normal rate and regular rhythm.     Pulses: Normal pulses.     Heart sounds: No murmur.  Pulmonary:     Effort: Pulmonary effort is normal. No respiratory distress.     Breath sounds: Normal breath sounds. No stridor. No wheezing or rhonchi.  Abdominal:     General: Abdomen is flat. Bowel sounds are normal. There is no distension.     Palpations: There is no mass.     Tenderness: There is no abdominal tenderness. There is no right CVA tenderness or left CVA tenderness.  Musculoskeletal:        General: No swelling.  Neurological:     Mental Status: He is alert.      UC Treatments / Results  Labs (all labs ordered are listed, but only abnormal results are displayed) Labs Reviewed - No data to display  EKG None  Radiology No results  found.  Procedures Procedures (including critical care time)  Medications Ordered in UC Medications - No data to display  Initial Impression / Assessment and Plan / UC Course  I have reviewed the triage vital signs and the nursing notes.  Pertinent labs & imaging results that were available during my care of the patient were reviewed by me and considered in my medical decision making (see chart for details).  Clinical Course as of Nov 22 1602  Sat Nov 22, 2018  1557 UTI symptoms. UA looks ok except for trace blood. Urine sent for culture. No need for A/B today pending culture report. Keep well hydrated. F/U soon if no improvement. STD screen not done. She said he has no chance of STD infection.   [KE]  1557 Essential hypertension. Chronic. He is compliant with his meds. No neurologic or cardiopulmonary symptoms. Continue current regimen. PCP f/u recommended for medication adjustment.   [KE]    Clinical Course User Index [KE] Doreene ElandEniola, Ange Puskas T, MD    Dysuria  Essential hypertension   Final Clinical Impressions(s) / UC Diagnoses   Final diagnoses:  None   Discharge Instructions   None    ED Prescriptions    None     Controlled Substance Prescriptions [place] Controlled Substance Registry consulted? Not Applicable   Doreene ElandEniola, Loveda Colaizzi T, MD 11/22/18 [phone]

## 2018-11-23 LAB — URINE CULTURE: Culture: NO GROWTH

## 2018-11-24 ENCOUNTER — Telehealth: Payer: Self-pay | Admitting: Family Medicine

## 2018-11-24 NOTE — Telephone Encounter (Signed)
HIPAA compliant callback message left.   Please let him know that his urine culture is negative when he calls. F/U with PCP or urology if still having symptoms.

## 2018-11-24 NOTE — Telephone Encounter (Signed)
Patient aware. Jalyssa Fleisher, RN ([facility] RN)  

## 2019-01-08 ENCOUNTER — Other Ambulatory Visit: Payer: Self-pay | Admitting: Nurse Practitioner

## 2019-01-08 ENCOUNTER — Ambulatory Visit
Admission: RE | Admit: 2019-01-08 | Discharge: 2019-01-08 | Disposition: A | Discharge status: Home / Self Care | Payer: PRIVATE HEALTH INSURANCE | Source: Ambulatory Visit | Attending: Family Medicine | Admitting: Family Medicine

## 2019-01-08 DIAGNOSIS — R1010 Upper abdominal pain, unspecified: Secondary | ICD-10-CM

## 2019-01-08 DIAGNOSIS — R1030 Lower abdominal pain, unspecified: Secondary | ICD-10-CM

## 2019-07-27 ENCOUNTER — Other Ambulatory Visit: Payer: Self-pay

## 2019-07-27 ENCOUNTER — Other Ambulatory Visit (HOSPITAL_COMMUNITY)
Admission: RE | Admit: 2019-07-27 | Discharge: 2019-07-27 | Disposition: A | Discharge status: Home / Self Care | Payer: BLUE CROSS/BLUE SHIELD | Source: Ambulatory Visit | Attending: Nurse Practitioner | Admitting: Nurse Practitioner

## 2019-07-27 DIAGNOSIS — R7303 Prediabetes: Secondary | ICD-10-CM | POA: Diagnosis not present

## 2019-07-27 DIAGNOSIS — Z113 Encounter for screening for infections with a predominantly sexual mode of transmission: Secondary | ICD-10-CM | POA: Insufficient documentation

## 2019-07-27 DIAGNOSIS — I10 Essential (primary) hypertension: Secondary | ICD-10-CM | POA: Diagnosis not present

## 2019-07-27 DIAGNOSIS — E669 Obesity, unspecified: Secondary | ICD-10-CM | POA: Diagnosis not present

## 2019-07-27 DIAGNOSIS — E782 Mixed hyperlipidemia: Secondary | ICD-10-CM | POA: Diagnosis not present

## 2019-07-29 LAB — URINE CYTOLOGY ANCILLARY ONLY
Bacterial vaginitis: NEGATIVE
Candida vaginitis: NEGATIVE
Chlamydia: NEGATIVE
Neisseria Gonorrhea: NEGATIVE
Trichomonas: NEGATIVE

## 2019-11-07 DIAGNOSIS — R309 Painful micturition, unspecified: Secondary | ICD-10-CM | POA: Diagnosis not present

## 2019-11-07 DIAGNOSIS — I10 Essential (primary) hypertension: Secondary | ICD-10-CM | POA: Diagnosis not present

## 2019-11-07 DIAGNOSIS — E782 Mixed hyperlipidemia: Secondary | ICD-10-CM | POA: Diagnosis not present

## 2019-11-07 DIAGNOSIS — E119 Type 2 diabetes mellitus without complications: Secondary | ICD-10-CM | POA: Diagnosis not present

## 2019-11-07 DIAGNOSIS — R1010 Upper abdominal pain, unspecified: Secondary | ICD-10-CM | POA: Diagnosis not present

## 2019-11-07 DIAGNOSIS — R7303 Prediabetes: Secondary | ICD-10-CM | POA: Diagnosis not present

## 2019-11-10 LAB — MOLECULAR ANCILLARY ONLY
Bacterial Vaginitis (gardnerella): NEGATIVE
Candida Glabrata: NEGATIVE
Candida Vaginitis: NEGATIVE
Chlamydia: NEGATIVE
Comment: NEGATIVE
Comment: NEGATIVE
Comment: NEGATIVE
Comment: NEGATIVE
Comment: NEGATIVE
Comment: NORMAL
Neisseria Gonorrhea: NEGATIVE
Trichomonas: POSITIVE — AB

## 2020-01-10 ENCOUNTER — Encounter (HOSPITAL_COMMUNITY): Payer: Self-pay

## 2020-01-10 ENCOUNTER — Ambulatory Visit (HOSPITAL_COMMUNITY)
Admission: EM | Admit: 2020-01-10 | Discharge: 2020-01-10 | Disposition: A | Discharge status: Home / Self Care | Payer: BC Managed Care – PPO | Attending: Family Medicine | Admitting: Family Medicine

## 2020-01-10 ENCOUNTER — Other Ambulatory Visit: Payer: Self-pay

## 2020-01-10 DIAGNOSIS — R399 Unspecified symptoms and signs involving the genitourinary system: Secondary | ICD-10-CM

## 2020-01-10 LAB — POCT URINALYSIS DIP (DEVICE)
Bilirubin Urine: NEGATIVE
Glucose, UA: NEGATIVE mg/dL
Ketones, ur: NEGATIVE mg/dL
Leukocytes,Ua: NEGATIVE
Nitrite: NEGATIVE
Protein, ur: NEGATIVE mg/dL
Specific Gravity, Urine: >=1.03 (ref 1.005–1.030)
Urobilinogen, UA: 0.2 mg/dL (ref 0.0–1.0)
pH: 5.5 (ref 5.0–8.0)

## 2020-01-10 NOTE — ED Provider Notes (Signed)
[place]    CSN: [id number] Arrival date & time: 01/10/20  1045      History   Chief Complaint Chief Complaint  Patient presents with  . Urinary Tract Infection    HPI Reginald Gonzalez is a 49 y.o. male.   He is presenting with dysuria.  Symptoms been ongoing for a few days.  Has a history of diabetes but is uncontrolled.  No trauma.  He is a Administrator.  Denies any chance of sexually transmitted disease.  HPI  Past Medical History:  Diagnosis Date  . Diabetes mellitus   . Hypertension     Patient Active Problem List   Diagnosis Date Noted  . Diabetes mellitus 10/21/2011  . HTN (hypertension) 10/21/2011    History reviewed. No pertinent surgical history.     Home Medications    Prior to Admission medications   Medication Sig Start Date End Date Taking? Authorizing Provider  amLODipine-valsartan (EXFORGE) 5-160 MG per tablet Take 1 tablet by mouth daily.     Yes [provider]    Family History Family History  Problem Relation Age of Onset  . Hypertension Mother   . Healthy Father     Social History Social History   Tobacco Use  . Smoking status: Former Research scientist (life sciences)  . Smokeless tobacco: Never Used  Substance Use Topics  . Alcohol use: Yes    Comment: occasional  . Drug use: No     Allergies   Patient has no known allergies.   Review of Systems Review of Systems See HPI  Physical Exam Triage Vital Signs ED Triage Vitals  Enc Vitals Group     BP 01/10/20 1058 (!) 138/95     Pulse Rate 01/10/20 1058 79     Resp 01/10/20 1058 16     Temp 01/10/20 1058 98.5 F (36.9 C)     Temp Source 01/10/20 1058 Oral     SpO2 01/10/20 1058 100 %     Weight --      Height --      Head Circumference --      Peak Flow --      Pain Score 01/10/20 1057 4     Pain Loc --      Pain Edu? --      Excl. in [place]? --    No data found.  Updated Vital Signs BP (!) 138/95 (BP Location: Left Arm)   Pulse 79   Temp 98.5 F (36.9 C) (Oral)    Resp 16   SpO2 100%   Visual Acuity Right Eye Distance:   Left Eye Distance:   Bilateral Distance:    Right Eye Near:   Left Eye Near:    Bilateral Near:     Physical Exam Gen: NAD, alert, cooperative with exam, well-appearing ENT: normal lips, normal nasal mucosa,  Eye: normal EOM, normal conjunctiva and lids   Resp: no accessory muscle use, non-labored,  GI: no masses or tenderness, no hernia,  Skin: no rashes, no areas of induration  Neuro: normal tone, normal sensation to touch Psych:  normal insight, alert and oriented   UC Treatments / Results  Labs (all labs ordered are listed, but only abnormal results are displayed) Labs Reviewed  POCT URINALYSIS DIP (DEVICE) - Abnormal; Notable for the following components:      Result Value   Hgb urine dipstick TRACE (*)    All other components within normal limits    EKG   Radiology  No results found.  Procedures Procedures (including critical care time)  Medications Ordered in UC Medications - No data to display  Initial Impression / Assessment and Plan / UC Course  I have reviewed the triage vital signs and the nursing notes.  Pertinent labs & imaging results that were available during my care of the patient were reviewed by me and considered in my medical decision making (see chart for details).     [name] is a 49 year old male that is presenting with urinary complaints.  Urinalysis with not demonstrating infection or significant hematuria.  He denies chance of sexually transmitted infection.  He does drink limited water.  Could be irritation of the urethra from soap and scrubbing.  Counseled on supportive care.  Given indications to follow-up.   Final Clinical Impressions(s) / UC Diagnoses   Final diagnoses:  Urinary symptom or sign     Discharge Instructions     Please try to stay well hydrated and drink water  Please follow up if your symptoms fail to improve.     ED Prescriptions    None      PDMP not reviewed this encounter.   Myra Rude, MD 01/10/20 [phone]

## 2020-01-10 NOTE — ED Triage Notes (Signed)
Patient presents to Urgent Care with complaints of painful urination since 2 weeks ago. 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

## 2020-01-10 NOTE — Discharge Instructions (Signed)
Please try to stay well hydrated and drink water  Please follow up if your symptoms fail to improve.

## 2020-02-08 DIAGNOSIS — E782 Mixed hyperlipidemia: Secondary | ICD-10-CM | POA: Diagnosis not present

## 2020-02-08 DIAGNOSIS — I10 Essential (primary) hypertension: Secondary | ICD-10-CM | POA: Diagnosis not present

## 2020-02-08 DIAGNOSIS — B353 Tinea pedis: Secondary | ICD-10-CM | POA: Diagnosis not present

## 2020-02-08 DIAGNOSIS — R300 Dysuria: Secondary | ICD-10-CM | POA: Diagnosis not present

## 2020-02-08 DIAGNOSIS — R7303 Prediabetes: Secondary | ICD-10-CM | POA: Diagnosis not present

## 2020-03-08 ENCOUNTER — Inpatient Hospital Stay (HOSPITAL_COMMUNITY)
Admission: EM | Admit: 2020-03-08 | Discharge: 2020-03-10 | DRG: 281 | Disposition: A | Discharge status: Home / Self Care | Payer: BC Managed Care – PPO | Attending: Cardiovascular Disease | Admitting: Cardiovascular Disease

## 2020-03-08 ENCOUNTER — Emergency Department (HOSPITAL_COMMUNITY): Payer: BC Managed Care – PPO

## 2020-03-08 ENCOUNTER — Encounter (HOSPITAL_COMMUNITY): Payer: Self-pay | Admitting: Emergency Medicine

## 2020-03-08 ENCOUNTER — Other Ambulatory Visit: Payer: Self-pay

## 2020-03-08 DIAGNOSIS — R0602 Shortness of breath: Secondary | ICD-10-CM | POA: Diagnosis not present

## 2020-03-08 DIAGNOSIS — E876 Hypokalemia: Secondary | ICD-10-CM | POA: Diagnosis present

## 2020-03-08 DIAGNOSIS — R079 Chest pain, unspecified: Secondary | ICD-10-CM | POA: Diagnosis not present

## 2020-03-08 DIAGNOSIS — Z6841 Body Mass Index (BMI) 40.0 and over, adult: Secondary | ICD-10-CM

## 2020-03-08 DIAGNOSIS — Z79899 Other long term (current) drug therapy: Secondary | ICD-10-CM

## 2020-03-08 DIAGNOSIS — Z7984 Long term (current) use of oral hypoglycemic drugs: Secondary | ICD-10-CM

## 2020-03-08 DIAGNOSIS — I1310 Hypertensive heart and chronic kidney disease without heart failure, with stage 1 through stage 4 chronic kidney disease, or unspecified chronic kidney disease: Secondary | ICD-10-CM | POA: Diagnosis not present

## 2020-03-08 DIAGNOSIS — I422 Other hypertrophic cardiomyopathy: Secondary | ICD-10-CM

## 2020-03-08 DIAGNOSIS — E119 Type 2 diabetes mellitus without complications: Secondary | ICD-10-CM

## 2020-03-08 DIAGNOSIS — Z20822 Contact with and (suspected) exposure to covid-19: Secondary | ICD-10-CM | POA: Diagnosis not present

## 2020-03-08 DIAGNOSIS — Z87891 Personal history of nicotine dependence: Secondary | ICD-10-CM | POA: Diagnosis not present

## 2020-03-08 DIAGNOSIS — Z8249 Family history of ischemic heart disease and other diseases of the circulatory system: Secondary | ICD-10-CM

## 2020-03-08 DIAGNOSIS — I2510 Atherosclerotic heart disease of native coronary artery without angina pectoris: Secondary | ICD-10-CM | POA: Diagnosis present

## 2020-03-08 DIAGNOSIS — I071 Rheumatic tricuspid insufficiency: Secondary | ICD-10-CM | POA: Diagnosis not present

## 2020-03-08 DIAGNOSIS — E1122 Type 2 diabetes mellitus with diabetic chronic kidney disease: Secondary | ICD-10-CM | POA: Diagnosis present

## 2020-03-08 DIAGNOSIS — I517 Cardiomegaly: Secondary | ICD-10-CM | POA: Diagnosis not present

## 2020-03-08 DIAGNOSIS — E8809 Other disorders of plasma-protein metabolism, not elsewhere classified: Secondary | ICD-10-CM | POA: Diagnosis present

## 2020-03-08 DIAGNOSIS — E785 Hyperlipidemia, unspecified: Secondary | ICD-10-CM

## 2020-03-08 DIAGNOSIS — Z87440 Personal history of urinary (tract) infections: Secondary | ICD-10-CM

## 2020-03-08 DIAGNOSIS — N1831 Chronic kidney disease, stage 3a: Secondary | ICD-10-CM | POA: Diagnosis not present

## 2020-03-08 DIAGNOSIS — I214 Non-ST elevation (NSTEMI) myocardial infarction: Secondary | ICD-10-CM | POA: Diagnosis not present

## 2020-03-08 DIAGNOSIS — I10 Essential (primary) hypertension: Secondary | ICD-10-CM | POA: Diagnosis present

## 2020-03-08 LAB — PROTIME-INR
INR: 1 (ref 0.8–1.2)
Prothrombin Time: 13 seconds (ref 11.4–15.2)

## 2020-03-08 LAB — CBC
HCT: 44.7 % (ref 39.0–52.0)
Hemoglobin: 14.3 g/dL (ref 13.0–17.0)
MCH: 26 pg (ref 26.0–34.0)
MCHC: 32 g/dL (ref 30.0–36.0)
MCV: 81.3 fL (ref 80.0–100.0)
Platelets: 286 10*3/uL (ref 150–400)
RBC: 5.5 MIL/uL (ref 4.22–5.81)
RDW: 14.9 % (ref 11.5–15.5)
WBC: 9.8 10*3/uL (ref 4.0–10.5)
nRBC: 0 % (ref 0.0–0.2)

## 2020-03-08 LAB — BASIC METABOLIC PANEL
Anion gap: 12 (ref 5–15)
BUN: 18 mg/dL (ref 6–20)
CO2: 22 mmol/L (ref 22–32)
Calcium: 8.7 mg/dL — ABNORMAL LOW (ref 8.9–10.3)
Chloride: 99 mmol/L (ref 98–111)
Creatinine, Ser: 1.58 mg/dL — ABNORMAL HIGH (ref 0.61–1.24)
GFR calc Af Amer: 59 mL/min — ABNORMAL LOW (ref >60)
GFR calc non Af Amer: 51 mL/min — ABNORMAL LOW (ref >60)
Glucose, Bld: 137 mg/dL — ABNORMAL HIGH (ref 70–99)
Potassium: 3.3 mmol/L — ABNORMAL LOW (ref 3.5–5.1)
Sodium: 133 mmol/L — ABNORMAL LOW (ref 135–145)

## 2020-03-08 LAB — TROPONIN I (HIGH SENSITIVITY)
Troponin I (High Sensitivity): 154 ng/L (ref <18)
Troponin I (High Sensitivity): 250 ng/L (ref <18)

## 2020-03-08 MED ORDER — ASPIRIN 81 MG PO CHEW
324.0000 mg | CHEWABLE_TABLET | Freq: Once | ORAL | Status: AC
  Administered 2020-03-08: 324 mg via ORAL
  Filled 2020-03-08: qty 4

## 2020-03-08 MED ORDER — SODIUM CHLORIDE 0.9% FLUSH: 3.0000 mL | Freq: Once | INTRAVENOUS | Status: DC

## 2020-03-08 NOTE — ED Triage Notes (Signed)
Patient reports central chest pain for 2 days with SOB , denies emesis or diaphoresis , no cough or fever .

## 2020-03-08 NOTE — ED Provider Notes (Signed)
[facility] EMERGENCY DEPARTMENT Provider Note   CSN: [id number] Arrival date & time: 03/08/20  1911     History Chief Complaint  Patient presents with  . Chest Pain    KOBEY SIDES is a 49 y.o. male.  The history is provided by the patient and medical records.  Chest Pain   49 year old male with history of diabetes, hypertension, obesity, presenting to the ED with chest pain.  States this has been intermittent over the past 2 days.  Pain seems to be random in onset lasting anywhere from 30 to 45 minutes when occurring and then resolve spontaneously.  Pain occurring all across the chest, worse in the mid-sternal region without radiation into the arms or neck. He does report shortness of breath and some lightheadedness when this occurs.  He denies any pain with exertional activity.  No cough, fever, or sick contacts.  He denies any known cardiac history. No family cardiac history.  He is not a smoker.  Past Medical History:  Diagnosis Date  . Diabetes mellitus   . Hypertension     Patient Active Problem List   Diagnosis Date Noted  . Diabetes mellitus 10/21/2011  . HTN (hypertension) 10/21/2011    History reviewed. No pertinent surgical history.     Family History  Problem Relation Age of Onset  . Hypertension Mother   . Healthy Father     Social History   Tobacco Use  . Smoking status: Former Research scientist (life sciences)  . Smokeless tobacco: Never Used  Substance Use Topics  . Alcohol use: Yes    Comment: occasional  . Drug use: No    Home Medications Prior to Admission medications   Medication Sig Start Date End Date Taking? Authorizing Provider  amLODipine-valsartan (EXFORGE) 5-160 MG per tablet Take 1 tablet by mouth daily.      [provider]    Allergies    Patient has no known allergies.  Review of Systems   Review of Systems  Cardiovascular: Positive for chest pain.  All other systems reviewed and are negative.   Physical  Exam Updated Vital Signs BP 128/88 (BP Location: Right Arm)   Pulse (!) 120   Temp 98.3 F (36.8 C) (Oral)   Resp 16   Ht 5\' 3"  (1.6 m)   Wt 130 kg   SpO2 97%   BMI 50.77 kg/m   Physical Exam Vitals and nursing note reviewed.  Constitutional:      Appearance: He is well-developed.     Comments: Obese, NAD  HENT:     Head: Normocephalic and atraumatic.  Eyes:     Conjunctiva/sclera: Conjunctivae normal.     Pupils: Pupils are equal, round, and reactive to light.  Cardiovascular:     Rate and Rhythm: Normal rate and regular rhythm.     Heart sounds: Normal heart sounds.  Pulmonary:     Effort: Pulmonary effort is normal.     Breath sounds: Normal breath sounds. No decreased breath sounds or wheezing.  Abdominal:     General: Bowel sounds are normal.     Palpations: Abdomen is soft.  Musculoskeletal:        General: Normal range of motion.     Cervical back: Normal range of motion.  Skin:    General: Skin is warm and dry.  Neurological:     Mental Status: He is alert and oriented to person, place, and time.     ED Results / Procedures / Treatments  Labs (all labs ordered are listed, but only abnormal results are displayed) Labs Reviewed  BASIC METABOLIC PANEL - Abnormal; Notable for the following components:      Result Value   Sodium 133 (*)    Potassium 3.3 (*)    Glucose, Bld 137 (*)    Creatinine, Ser 1.58 (*)    Calcium 8.7 (*)    GFR calc non Af Amer 51 (*)    GFR calc Af Amer 59 (*)    All other components within normal limits  TROPONIN I (HIGH SENSITIVITY) - Abnormal; Notable for the following components:   Troponin I (High Sensitivity) 154 (*)    All other components within normal limits  CBC  PROTIME-INR  TROPONIN I (HIGH SENSITIVITY)    EKG EKG Interpretation  Date/Time:  Tuesday March 08 2020 19:37:21 EDT Ventricular Rate:  119 PR Interval:  148 QRS Duration: 80 QT Interval:  316 QTC Calculation: 444 R Axis:   54 Text  Interpretation: Sinus tachycardia Non-specific ST-t changes Confirmed by Cathren Laine ([id number]) on 03/08/2020 10:08:04 PM   Radiology DG Chest 2 View  Result Date: 03/08/2020 CLINICAL DATA:  Chest pain, central chest pain and shortness of breath for 2 days EXAM: CHEST - 2 VIEW COMPARISON:  None. FINDINGS: Hazy and streaky opacities in the lung bases and more focally within the right middle lobe. No pneumothorax or effusion. No convincing features of edema. The cardiomediastinal contours are unremarkable. No acute osseous or soft tissue abnormality. IMPRESSION: Hazy and streaky opacities in the lung bases and more focally within the right middle lobe. Favor atelectasis though early infection could have a similar appearance. Electronically Signed   By: Kreg Shropshire M.D.   On: 03/08/2020 20:18   CT Angio Chest PE W and/or Wo Contrast  Result Date: 03/09/2020 CLINICAL DATA:  Shortness of breath, chest pain, elevated troponin, centralized chest pain for 2 days EXAM: CT ANGIOGRAPHY CHEST WITH CONTRAST TECHNIQUE: Multidetector CT imaging of the chest was performed using the standard protocol during bolus administration of intravenous contrast. Multiplanar CT image reconstructions and MIPs were obtained to evaluate the vascular anatomy. CONTRAST:  12mL OMNIPAQUE IOHEXOL 350 MG/ML SOLN COMPARISON:  03/08/2020 FINDINGS: Cardiovascular: This is a technically adequate evaluation of the pulmonary vasculature. No filling defects or pulmonary emboli. The heart is unremarkable without pericardial effusion. Minimal atherosclerosis within the left main coronary artery and LAD distribution. Thoracic aorta is normal, without aneurysm or dissection. Mediastinum/Nodes: No enlarged mediastinal, hilar, or axillary lymph nodes. Thyroid gland, trachea, and esophagus demonstrate no significant findings. Lungs/Pleura: No airspace disease, effusion, or pneumothorax. Minimal hypoventilatory changes at the lung bases. Central airways are  patent. Upper Abdomen: No acute abnormality. Musculoskeletal: No acute or destructive bony lesions. Reconstructed images demonstrate no additional findings. Review of the MIP images confirms the above findings. IMPRESSION: 1. No evidence of pulmonary embolus. 2. No acute intrathoracic process. 3. Mild coronary artery atherosclerosis. Electronically Signed   By: Sharlet Salina M.D.   On: 03/09/2020 02:38    Procedures Procedures (including critical care time)  CRITICAL CARE Performed by: Garlon Hatchet   Total critical care time: 45 minutes  Critical care time was exclusive of separately billable procedures and treating other patients.  Critical care was necessary to treat or prevent imminent or life-threatening deterioration.  Critical care was time spent personally by me on the following activities: development of treatment plan with patient and/or surrogate as well as nursing, discussions with consultants, evaluation of patient's response to  treatment, examination of patient, obtaining history from patient or surrogate, ordering and performing treatments and interventions, ordering and review of laboratory studies, ordering and review of radiographic studies, pulse oximetry and re-evaluation of patient's condition.   Medications Ordered in ED Medications  sodium chloride flush (NS) 0.9 % injection 3 mL (has no administration in time range)  heparin ADULT infusion 100 units/mL (25000 units/275mL sodium chloride 0.45%) (1,450 Units/hr Intravenous New Bag/Given 03/09/20 0111)  aspirin chewable tablet 324 mg (324 mg Oral Given 03/08/20 2356)  heparin bolus via infusion 4,000 Units (4,000 Units Intravenous Bolus from Bag 03/09/20 0111)  iohexol (OMNIPAQUE) 350 MG/ML injection 75 mL (75 mLs Intravenous Contrast Given 03/09/20 0230)    ED Course  I have reviewed the triage vital signs and the nursing notes.  Pertinent labs & imaging results that were available during my care of the patient were  reviewed by me and considered in my medical decision making (see chart for details).    MDM Rules/Calculators/A&P  49 y.o. M here with intermittent chest pain x2 days.  Pain random in onset, lasting approx 30-45 mins at a time, localized to central chest, before resolving spontaneously.  Does have some associated SOB and lightheadedness when this occurs.  Denies cough, fever, or sick contacts.  EKG with some non-specific t-wave changes.  Initial labs with troponin of 154.  CXR with likely atelectasis (feel less likely infectious given lack of cough/fever).  Results discussed with patient.  He has had no prior cardiac evaluation in the past.  Patient with NSTEMI.  Given dose of ASA here, will consult cardiology.  Discussed with on call cardiology, Dr. Julianne Handler-- will come and evaluate patient.  11:42 PM Delta troponin 250.    12:33 AM Dr. Julianne Handler has evaluated-- recommended CTA chest to assess for PE.  Will start heparin now.  He will admit for ongoing care.  3:01 AM CTA negative for acute PE.  Patient remains hemodynamically stable.  Cardiology notified of CT results, they will admit.  Final Clinical Impression(s) / ED Diagnoses Final diagnoses:  NSTEMI (non-ST elevated myocardial infarction) [facility])    Rx / DC Orders ED Discharge Orders    None       Garlon Hatchet, PA-C 03/09/20 0343    Ward, Layla Maw, DO 03/09/20 [phone]

## 2020-03-08 NOTE — ED Notes (Signed)
Dr. Denton Lank notified of elevated trop

## 2020-03-09 ENCOUNTER — Emergency Department (HOSPITAL_COMMUNITY): Payer: BC Managed Care – PPO

## 2020-03-09 ENCOUNTER — Encounter (HOSPITAL_COMMUNITY): Payer: Self-pay | Admitting: Radiology

## 2020-03-09 ENCOUNTER — Encounter (HOSPITAL_COMMUNITY)
Admission: EM | Disposition: A | Discharge status: Home / Self Care | Payer: Self-pay | Source: Home / Self Care | Attending: Cardiology

## 2020-03-09 ENCOUNTER — Inpatient Hospital Stay (HOSPITAL_COMMUNITY): Payer: BC Managed Care – PPO

## 2020-03-09 DIAGNOSIS — E785 Hyperlipidemia, unspecified: Secondary | ICD-10-CM | POA: Diagnosis present

## 2020-03-09 DIAGNOSIS — Z87440 Personal history of urinary (tract) infections: Secondary | ICD-10-CM | POA: Diagnosis not present

## 2020-03-09 DIAGNOSIS — Z87891 Personal history of nicotine dependence: Secondary | ICD-10-CM | POA: Diagnosis not present

## 2020-03-09 DIAGNOSIS — I2510 Atherosclerotic heart disease of native coronary artery without angina pectoris: Secondary | ICD-10-CM | POA: Diagnosis present

## 2020-03-09 DIAGNOSIS — I422 Other hypertrophic cardiomyopathy: Secondary | ICD-10-CM | POA: Diagnosis not present

## 2020-03-09 DIAGNOSIS — N1831 Chronic kidney disease, stage 3a: Secondary | ICD-10-CM | POA: Diagnosis present

## 2020-03-09 DIAGNOSIS — Z7984 Long term (current) use of oral hypoglycemic drugs: Secondary | ICD-10-CM | POA: Diagnosis not present

## 2020-03-09 DIAGNOSIS — Z6841 Body Mass Index (BMI) 40.0 and over, adult: Secondary | ICD-10-CM | POA: Diagnosis not present

## 2020-03-09 DIAGNOSIS — I071 Rheumatic tricuspid insufficiency: Secondary | ICD-10-CM | POA: Diagnosis present

## 2020-03-09 DIAGNOSIS — E8809 Other disorders of plasma-protein metabolism, not elsewhere classified: Secondary | ICD-10-CM | POA: Diagnosis present

## 2020-03-09 DIAGNOSIS — I214 Non-ST elevation (NSTEMI) myocardial infarction: Secondary | ICD-10-CM

## 2020-03-09 DIAGNOSIS — E1122 Type 2 diabetes mellitus with diabetic chronic kidney disease: Secondary | ICD-10-CM | POA: Diagnosis present

## 2020-03-09 DIAGNOSIS — E876 Hypokalemia: Secondary | ICD-10-CM | POA: Diagnosis present

## 2020-03-09 DIAGNOSIS — Z8249 Family history of ischemic heart disease and other diseases of the circulatory system: Secondary | ICD-10-CM | POA: Diagnosis not present

## 2020-03-09 DIAGNOSIS — I1310 Hypertensive heart and chronic kidney disease without heart failure, with stage 1 through stage 4 chronic kidney disease, or unspecified chronic kidney disease: Secondary | ICD-10-CM | POA: Diagnosis present

## 2020-03-09 DIAGNOSIS — R0602 Shortness of breath: Secondary | ICD-10-CM | POA: Diagnosis not present

## 2020-03-09 DIAGNOSIS — Z79899 Other long term (current) drug therapy: Secondary | ICD-10-CM | POA: Diagnosis not present

## 2020-03-09 DIAGNOSIS — Z20822 Contact with and (suspected) exposure to covid-19: Secondary | ICD-10-CM | POA: Diagnosis not present

## 2020-03-09 DIAGNOSIS — R079 Chest pain, unspecified: Secondary | ICD-10-CM | POA: Diagnosis not present

## 2020-03-09 HISTORY — PX: LEFT HEART CATH AND CORONARY ANGIOGRAPHY: CATH118249

## 2020-03-09 HISTORY — PX: INTRAVASCULAR ULTRASOUND/IVUS: CATH118244

## 2020-03-09 LAB — MAGNESIUM: Magnesium: 1.9 mg/dL (ref 1.7–2.4)

## 2020-03-09 LAB — PROTIME-INR
INR: 1.1 (ref 0.8–1.2)
Prothrombin Time: 13.8 seconds (ref 11.4–15.2)

## 2020-03-09 LAB — CREATININE, SERUM
Creatinine, Ser: 1.25 mg/dL — ABNORMAL HIGH (ref 0.61–1.24)
GFR calc Af Amer: >60 mL/min (ref >60)
GFR calc non Af Amer: >60 mL/min (ref >60)

## 2020-03-09 LAB — CBC
HCT: 44.6 % (ref 39.0–52.0)
HCT: 41.3 % (ref 39.0–52.0)
Hemoglobin: 14.2 g/dL (ref 13.0–17.0)
Hemoglobin: 13.3 g/dL (ref 13.0–17.0)
MCH: 26 pg (ref 26.0–34.0)
MCH: 26.4 pg (ref 26.0–34.0)
MCHC: 31.8 g/dL (ref 30.0–36.0)
MCHC: 32.2 g/dL (ref 30.0–36.0)
MCV: 81.7 fL (ref 80.0–100.0)
MCV: 82.1 fL (ref 80.0–100.0)
Platelets: 256 10*3/uL (ref 150–400)
Platelets: 255 10*3/uL (ref 150–400)
RBC: 5.46 MIL/uL (ref 4.22–5.81)
RBC: 5.03 MIL/uL (ref 4.22–5.81)
RDW: 15.1 % (ref 11.5–15.5)
RDW: 15.2 % (ref 11.5–15.5)
WBC: 8.9 10*3/uL (ref 4.0–10.5)
WBC: 8.3 10*3/uL (ref 4.0–10.5)
nRBC: 0 % (ref 0.0–0.2)
nRBC: 0 % (ref 0.0–0.2)

## 2020-03-09 LAB — BASIC METABOLIC PANEL
Anion gap: 12 (ref 5–15)
BUN: 17 mg/dL (ref 6–20)
CO2: 22 mmol/L (ref 22–32)
Calcium: 8.8 mg/dL — ABNORMAL LOW (ref 8.9–10.3)
Chloride: 102 mmol/L (ref 98–111)
Creatinine, Ser: 1.45 mg/dL — ABNORMAL HIGH (ref 0.61–1.24)
GFR calc Af Amer: >60 mL/min (ref >60)
GFR calc non Af Amer: 57 mL/min — ABNORMAL LOW (ref >60)
Glucose, Bld: 110 mg/dL — ABNORMAL HIGH (ref 70–99)
Potassium: 4.4 mmol/L (ref 3.5–5.1)
Sodium: 136 mmol/L (ref 135–145)

## 2020-03-09 LAB — GLUCOSE, CAPILLARY
Glucose-Capillary: 94 mg/dL (ref 70–99)
Glucose-Capillary: 108 mg/dL — ABNORMAL HIGH (ref 70–99)

## 2020-03-09 LAB — TROPONIN I (HIGH SENSITIVITY)
Troponin I (High Sensitivity): 475 ng/L (ref <18)
Troponin I (High Sensitivity): 621 ng/L (ref <18)
Troponin I (High Sensitivity): 527 ng/L (ref <18)
Troponin I (High Sensitivity): 415 ng/L (ref <18)

## 2020-03-09 LAB — LIPID PANEL
Cholesterol: 165 mg/dL (ref 0–200)
HDL: 37 mg/dL — ABNORMAL LOW (ref >40)
LDL Cholesterol: 115 mg/dL — ABNORMAL HIGH (ref 0–99)
Total CHOL/HDL Ratio: 4.5 RATIO
Triglycerides: 67 mg/dL (ref <150)
VLDL: 13 mg/dL (ref 0–40)

## 2020-03-09 LAB — CBG MONITORING, ED: Glucose-Capillary: 92 mg/dL (ref 70–99)

## 2020-03-09 LAB — BRAIN NATRIURETIC PEPTIDE: B Natriuretic Peptide: 22.5 pg/mL (ref 0.0–100.0)

## 2020-03-09 LAB — ECHOCARDIOGRAM COMPLETE
Height: 63 in
Weight: 4585.57 oz

## 2020-03-09 LAB — HIV ANTIBODY (ROUTINE TESTING W REFLEX): HIV Screen 4th Generation wRfx: NONREACTIVE

## 2020-03-09 LAB — POCT ACTIVATED CLOTTING TIME
Activated Clotting Time: 362 seconds
Activated Clotting Time: 362 seconds

## 2020-03-09 LAB — SARS CORONAVIRUS 2 (TAT 6-24 HRS): SARS Coronavirus 2: NEGATIVE

## 2020-03-09 LAB — HEPARIN LEVEL (UNFRACTIONATED): Heparin Unfractionated: 0.33 IU/mL (ref 0.30–0.70)

## 2020-03-09 LAB — TSH: TSH: 1.019 u[IU]/mL (ref 0.350–4.500)

## 2020-03-09 SURGERY — LEFT HEART CATH AND CORONARY ANGIOGRAPHY
Anesthesia: LOCAL

## 2020-03-09 MED ORDER — HEPARIN BOLUS VIA INFUSION
4000.0000 [IU] | Freq: Once | INTRAVENOUS | Status: AC
  Administered 2020-03-09: 4000 [IU] via INTRAVENOUS
  Filled 2020-03-09: qty 4000

## 2020-03-09 MED ORDER — ACETAMINOPHEN 325 MG PO TABS: 650.0000 mg | ORAL_TABLET | ORAL | Status: DC | PRN

## 2020-03-09 MED ORDER — ACETAMINOPHEN 325 MG PO TABS: 650.0000 mg | ORAL_TABLET | Freq: Four times a day (QID) | ORAL | Status: DC | PRN

## 2020-03-09 MED ORDER — METOPROLOL TARTRATE 25 MG PO TABS
25.0000 mg | ORAL_TABLET | Freq: Two times a day (BID) | ORAL | Status: DC
  Administered 2020-03-09 (×2): 25 mg via ORAL
  Filled 2020-03-09 (×2): qty 1

## 2020-03-09 MED ORDER — HEPARIN SODIUM (PORCINE) 1000 UNIT/ML IJ SOLN
INTRAMUSCULAR | Status: AC
  Filled 2020-03-09: qty 1

## 2020-03-09 MED ORDER — METOPROLOL TARTRATE 5 MG/5ML IV SOLN
INTRAVENOUS | Status: AC
  Filled 2020-03-09: qty 5

## 2020-03-09 MED ORDER — ONDANSETRON HCL 4 MG/2ML IJ SOLN: 4.0000 mg | Freq: Four times a day (QID) | INTRAMUSCULAR | Status: DC | PRN

## 2020-03-09 MED ORDER — MIDAZOLAM HCL 2 MG/2ML IJ SOLN
INTRAMUSCULAR | Status: DC | PRN
  Administered 2020-03-09: 1 mg via INTRAVENOUS
  Administered 2020-03-09: 2 mg via INTRAVENOUS

## 2020-03-09 MED ORDER — POTASSIUM CHLORIDE CRYS ER 20 MEQ PO TBCR
40.0000 meq | EXTENDED_RELEASE_TABLET | Freq: Every day | ORAL | Status: DC
  Administered 2020-03-10: 40 meq via ORAL
  Filled 2020-03-09 (×2): qty 2

## 2020-03-09 MED ORDER — LIDOCAINE HCL (PF) 1 % IJ SOLN
INTRAMUSCULAR | Status: DC | PRN
  Administered 2020-03-09: 2 mL

## 2020-03-09 MED ORDER — LIDOCAINE HCL (PF) 1 % IJ SOLN
INTRAMUSCULAR | Status: AC
  Filled 2020-03-09: qty 30

## 2020-03-09 MED ORDER — HEPARIN SODIUM (PORCINE) 1000 UNIT/ML IJ SOLN
INTRAMUSCULAR | Status: DC | PRN
  Administered 2020-03-09: 4000 [IU] via INTRAVENOUS
  Administered 2020-03-09 (×2): 5000 [IU] via INTRAVENOUS

## 2020-03-09 MED ORDER — SODIUM CHLORIDE 0.9 % WEIGHT BASED INFUSION: 3.0000 mL/kg/h | INTRAVENOUS | Status: DC

## 2020-03-09 MED ORDER — VERAPAMIL HCL 2.5 MG/ML IV SOLN
INTRAVENOUS | Status: DC | PRN
  Administered 2020-03-09: 10 mL via INTRA_ARTERIAL

## 2020-03-09 MED ORDER — HYDRALAZINE HCL 20 MG/ML IJ SOLN: 10.0000 mg | INTRAMUSCULAR | Status: AC | PRN

## 2020-03-09 MED ORDER — INSULIN ASPART 100 UNIT/ML ~~LOC~~ SOLN: 0.0000 [IU] | Freq: Every day | SUBCUTANEOUS | Status: DC

## 2020-03-09 MED ORDER — HEPARIN (PORCINE) 25000 UT/250ML-% IV SOLN
1450.0000 [IU]/h | INTRAVENOUS | Status: DC
  Administered 2020-03-09: 1450 [IU]/h via INTRAVENOUS
  Filled 2020-03-09: qty 250

## 2020-03-09 MED ORDER — ATORVASTATIN CALCIUM 10 MG PO TABS
20.0000 mg | ORAL_TABLET | Freq: Every day | ORAL | Status: DC
  Administered 2020-03-09: 20 mg via ORAL
  Filled 2020-03-09: qty 2

## 2020-03-09 MED ORDER — NITROGLYCERIN 1 MG/10 ML FOR IR/CATH LAB
INTRA_ARTERIAL | Status: AC
  Filled 2020-03-09: qty 10

## 2020-03-09 MED ORDER — IOHEXOL 350 MG/ML SOLN
INTRAVENOUS | Status: DC | PRN
  Administered 2020-03-09: 120 mL

## 2020-03-09 MED ORDER — SODIUM CHLORIDE 0.9% FLUSH: 3.0000 mL | INTRAVENOUS | Status: DC | PRN

## 2020-03-09 MED ORDER — FENTANYL CITRATE (PF) 100 MCG/2ML IJ SOLN
INTRAMUSCULAR | Status: AC
  Filled 2020-03-09: qty 2

## 2020-03-09 MED ORDER — SODIUM CHLORIDE 0.9 % IV SOLN: 250.0000 mL | INTRAVENOUS | Status: DC | PRN

## 2020-03-09 MED ORDER — INSULIN ASPART 100 UNIT/ML ~~LOC~~ SOLN: 0.0000 [IU] | Freq: Three times a day (TID) | SUBCUTANEOUS | Status: DC

## 2020-03-09 MED ORDER — HEPARIN (PORCINE) IN NACL 1000-0.9 UT/500ML-% IV SOLN
INTRAVENOUS | Status: DC | PRN
  Administered 2020-03-09 (×2): 500 mL

## 2020-03-09 MED ORDER — NITROGLYCERIN 0.4 MG SL SUBL: 0.4000 mg | SUBLINGUAL_TABLET | SUBLINGUAL | Status: DC | PRN

## 2020-03-09 MED ORDER — SODIUM CHLORIDE 0.9 % IV SOLN: INTRAVENOUS | Status: AC

## 2020-03-09 MED ORDER — MIDAZOLAM HCL 2 MG/2ML IJ SOLN
INTRAMUSCULAR | Status: AC
  Filled 2020-03-09: qty 2

## 2020-03-09 MED ORDER — METOPROLOL TARTRATE 5 MG/5ML IV SOLN
INTRAVENOUS | Status: DC | PRN
  Administered 2020-03-09: 5 mg via INTRAVENOUS

## 2020-03-09 MED ORDER — ASPIRIN 81 MG PO CHEW
81.0000 mg | CHEWABLE_TABLET | ORAL | Status: AC
  Administered 2020-03-09: 81 mg via ORAL
  Filled 2020-03-09: qty 1

## 2020-03-09 MED ORDER — HEPARIN (PORCINE) IN NACL 1000-0.9 UT/500ML-% IV SOLN
INTRAVENOUS | Status: AC
  Filled 2020-03-09: qty 1000

## 2020-03-09 MED ORDER — ASPIRIN 81 MG PO TBEC: 81.0000 mg | DELAYED_RELEASE_TABLET | ORAL | Status: DC | PRN

## 2020-03-09 MED ORDER — ATORVASTATIN CALCIUM 80 MG PO TABS: 80.0000 mg | ORAL_TABLET | Freq: Every day | ORAL | Status: DC

## 2020-03-09 MED ORDER — NITROGLYCERIN 1 MG/10 ML FOR IR/CATH LAB
INTRA_ARTERIAL | Status: DC | PRN
  Administered 2020-03-09 (×2): 200 ug via INTRACORONARY

## 2020-03-09 MED ORDER — IOHEXOL 350 MG/ML SOLN
75.0000 mL | Freq: Once | INTRAVENOUS | Status: AC | PRN
  Administered 2020-03-09: 75 mL via INTRAVENOUS

## 2020-03-09 MED ORDER — VERAPAMIL HCL 2.5 MG/ML IV SOLN
INTRAVENOUS | Status: AC
  Filled 2020-03-09: qty 2

## 2020-03-09 MED ORDER — SODIUM CHLORIDE 0.9% FLUSH
3.0000 mL | Freq: Two times a day (BID) | INTRAVENOUS | Status: DC
  Administered 2020-03-09 – 2020-03-10 (×2): 3 mL via INTRAVENOUS

## 2020-03-09 MED ORDER — ENOXAPARIN SODIUM 40 MG/0.4ML ~~LOC~~ SOLN
40.0000 mg | SUBCUTANEOUS | Status: DC
  Filled 2020-03-09: qty 0.4

## 2020-03-09 MED ORDER — LABETALOL HCL 5 MG/ML IV SOLN: 10.0000 mg | INTRAVENOUS | Status: AC | PRN

## 2020-03-09 MED ORDER — FENTANYL CITRATE (PF) 100 MCG/2ML IJ SOLN
INTRAMUSCULAR | Status: DC | PRN
  Administered 2020-03-09 (×2): 25 ug via INTRAVENOUS

## 2020-03-09 MED ORDER — SODIUM CHLORIDE 0.9 % WEIGHT BASED INFUSION
1.0000 mL/kg/h | INTRAVENOUS | Status: DC
  Administered 2020-03-09: 1 mL/kg/h via INTRAVENOUS

## 2020-03-09 MED ORDER — ASPIRIN EC 81 MG PO TBEC
81.0000 mg | DELAYED_RELEASE_TABLET | Freq: Every day | ORAL | Status: DC
  Administered 2020-03-10: 81 mg via ORAL
  Filled 2020-03-09: qty 1

## 2020-03-09 SURGICAL SUPPLY — 14 items
CATH 5FR JL3.5 JR4 ANG PIG MP (CATHETERS) ×1 IMPLANT
CATH LAUNCHER 6FR EBU 3.5 SH (CATHETERS) ×1 IMPLANT
CATH OPTICROSS HD (CATHETERS) ×1 IMPLANT
DEVICE RAD COMP TR BAND LRG (VASCULAR PRODUCTS) ×1 IMPLANT
GLIDESHEATH SLEND SS 6F .021 (SHEATH) ×1 IMPLANT
GUIDEWIRE INQWIRE 1.5J.035X260 (WIRE) IMPLANT
INQWIRE 1.5J .035X260CM (WIRE) ×2
KIT HEART LEFT (KITS) ×2 IMPLANT
KIT HEMO VALVE WATCHDOG (MISCELLANEOUS) ×1 IMPLANT
PACK CARDIAC CATHETERIZATION (CUSTOM PROCEDURE TRAY) ×2 IMPLANT
SLED PULL BACK IVUS (MISCELLANEOUS) ×1 IMPLANT
TRANSDUCER W/STOPCOCK (MISCELLANEOUS) ×2 IMPLANT
TUBING CIL FLEX 10 FLL-RA (TUBING) ×2 IMPLANT
WIRE ASAHI PROWATER 180CM (WIRE) ×2 IMPLANT

## 2020-03-09 NOTE — Progress Notes (Signed)
   Cardiac catheterization was discussed with the patient fully. The patient understands that risks include but are not limited to stroke (1 in 1000), death (1 in 1000), kidney failure [usually temporary] (1 in 500), bleeding (1 in 200), allergic reaction [possibly serious] (1 in 200).  The patient understands and is willing to proceed.     Theodore Demark, PA-C 03/09/2020 12:13 PM

## 2020-03-09 NOTE — Progress Notes (Signed)
  Echocardiogram 2D Echocardiogram has been performed.  Reginald Gonzalez 03/09/2020, 9:55 AM

## 2020-03-09 NOTE — Progress Notes (Signed)
ANTICOAGULATION CONSULT NOTE - Initial Consult  Pharmacy Consult for Heparin Indication: chest pain/ACS  No Known Allergies  Patient Measurements: Height: 5\' 3"  (160 cm) Weight: 130 kg (286 lb 9.6 oz) IBW/kg (Calculated) : 56.9 Heparin Dosing Weight: 90 kg  Vital Signs: Temp: 98.3 F (36.8 C) (04/13 1939) Temp Source: Oral (04/13 1939) BP: 128/88 (04/13 1939) Pulse Rate: 120 (04/13 1939)  Labs: Recent Labs    03/08/20 1949 03/08/20 2252  HGB 14.3  --   HCT 44.7  --   PLT 286  --   LABPROT 13.0  --   INR 1.0  --   CREATININE 1.58*  --   TROPONINIHS 154* 250*    Estimated Creatinine Clearance: 69.6 mL/min (A) (by C-G formula based on SCr of 1.58 mg/dL (H)).   Medical History: Past Medical History:  Diagnosis Date  . Diabetes mellitus   . Hypertension     Medications:  No current facility-administered medications on file prior to encounter.   Current Outpatient Medications on File Prior to Encounter  Medication Sig Dispense Refill  . amLODipine-valsartan (EXFORGE) 5-160 MG per tablet Take 1 tablet by mouth daily.         Assessment: 49 y.o. male with chest pain for heparin  Goal of Therapy:  Heparin level 0.3-0.7 units/ml Monitor platelets by anticoagulation protocol: Yes   Plan:  Heparin 4000 units IV bolus, then start heparin 1450 units/hr Check heparin level in 6 hours.   52 03/09/2020,12:40 AM

## 2020-03-09 NOTE — ED Notes (Signed)
Pt to CT

## 2020-03-09 NOTE — H&P (View-Only) (Signed)
Cardiology Progress Note  Patient ID: Reginald Gonzalez MRN: [id number] DOB: 1971-09-21 Date of Encounter: 03/09/2020  Primary Cardiologist: No primary care provider on file.  Subjective  No further chest pain.  Troponin still rising.  Denies shortness of breath or palpitations.  Telemetry unremarkable.  ROS:  All other ROS reviewed and negative. Pertinent positives noted in the HPI.     Inpatient Medications  Scheduled Meds: . [START ON 03/10/2020] aspirin EC  81 mg Oral Daily  . atorvastatin  80 mg Oral q1800  . metoprolol tartrate  25 mg Oral BID  . potassium chloride SA  40 mEq Oral Daily  . sodium chloride flush  3 mL Intravenous Once   Continuous Infusions: . heparin 1,450 Units/hr (03/09/20 0111)   PRN Meds: acetaminophen, nitroGLYCERIN, ondansetron (ZOFRAN) IV   Vital Signs   Vitals:   03/09/20 0515 03/09/20 0545 03/09/20 0600 03/09/20 0645  BP: 102/62 117/76 (!) 115/59 122/73  Pulse:      Resp:  18 16 (!) 22  Temp:      TempSrc:      SpO2:      Weight:      Height:       No intake or output data in the 24 hours ending 03/09/20 0755 Last 3 Weights 03/08/2020 03/08/2017  Weight (lbs) 286 lb 9.6 oz 265 lb  Weight (kg) 130 kg 120.203 kg      Telemetry  Overnight telemetry shows normal sinus rhythm with heart rate 80-90 bpm, which I personally reviewed.   ECG  The most recent ECG shows normal sinus rhythm, heart rate 79, old anteroseptal infarct, T wave inversions noted anterior lateral leads, which I personally reviewed.   Physical Exam   Vitals:   03/09/20 0515 03/09/20 0545 03/09/20 0600 03/09/20 0645  BP: 102/62 117/76 (!) 115/59 122/73  Pulse:      Resp:  18 16 (!) 22  Temp:      TempSrc:      SpO2:      Weight:      Height:       No intake or output data in the 24 hours ending 03/09/20 0755  Last 3 Weights 03/08/2020 03/08/2017  Weight (lbs) 286 lb 9.6 oz 265 lb  Weight (kg) 130 kg 120.203 kg    Body mass index is 50.77 kg/m.   General: Well  nourished, well developed, in no acute distress Head: Atraumatic, normal size  Eyes: PEERLA, EOMI  Neck: Supple, no JVD Endocrine: No thryomegaly Cardiac: Normal S1, S2; RRR; no murmurs, rubs, or gallops Lungs: Clear to auscultation bilaterally, no wheezing, rhonchi or rales  Abd: Soft, nontender, no hepatomegaly  Ext: No edema, pulses 2+ Musculoskeletal: No deformities, BUE and BLE strength normal and equal Skin: Warm and dry, no rashes   Neuro: Alert and oriented to person, place, time, and situation, CNII-XII grossly intact, no focal deficits  Psych: Normal mood and affect   Labs  High Sensitivity Troponin:   Recent Labs  Lab 03/08/20 1949 03/08/20 2252 03/09/20 0404  TROPONINIHS 154* 250* 475*     Cardiac EnzymesNo results for input(s): TROPONINI in the last 168 hours. No results for input(s): TROPIPOC in the last 168 hours.  Chemistry Recent Labs  Lab 03/08/20 1949 03/09/20 0404  NA 133* 136  K 3.3* 4.4  CL 99 102  CO2 22 22  GLUCOSE 137* 110*  BUN 18 17  CREATININE 1.58* 1.45*  CALCIUM 8.7* 8.8*  GFRNONAA 51* 57*  GFRAA  59* >60  ANIONGAP 12 12    Hematology Recent Labs  Lab 03/08/20 1949 03/09/20 0404  WBC 9.8 8.9  RBC 5.50 5.46  HGB 14.3 14.2  HCT 44.7 44.6  MCV 81.3 81.7  MCH 26.0 26.0  MCHC 32.0 31.8  RDW 14.9 15.1  PLT 286 256   BNPNo results for input(s): BNP, PROBNP in the last 168 hours.  DDimer No results for input(s): DDIMER in the last 168 hours.   Radiology  DG Chest 2 View  Result Date: 03/08/2020 CLINICAL DATA:  Chest pain, central chest pain and shortness of breath for 2 days EXAM: CHEST - 2 VIEW COMPARISON:  None. FINDINGS: Hazy and streaky opacities in the lung bases and more focally within the right middle lobe. No pneumothorax or effusion. No convincing features of edema. The cardiomediastinal contours are unremarkable. No acute osseous or soft tissue abnormality. IMPRESSION: Hazy and streaky opacities in the lung bases and more  focally within the right middle lobe. Favor atelectasis though early infection could have a similar appearance. Electronically Signed   By: Kreg Shropshire M.D.   On: 03/08/2020 20:18   CT Angio Chest PE W and/or Wo Contrast  Result Date: 03/09/2020 CLINICAL DATA:  Shortness of breath, chest pain, elevated troponin, centralized chest pain for 2 days EXAM: CT ANGIOGRAPHY CHEST WITH CONTRAST TECHNIQUE: Multidetector CT imaging of the chest was performed using the standard protocol during bolus administration of intravenous contrast. Multiplanar CT image reconstructions and MIPs were obtained to evaluate the vascular anatomy. CONTRAST:  22mL OMNIPAQUE IOHEXOL 350 MG/ML SOLN COMPARISON:  03/08/2020 FINDINGS: Cardiovascular: This is a technically adequate evaluation of the pulmonary vasculature. No filling defects or pulmonary emboli. The heart is unremarkable without pericardial effusion. Minimal atherosclerosis within the left main coronary artery and LAD distribution. Thoracic aorta is normal, without aneurysm or dissection. Mediastinum/Nodes: No enlarged mediastinal, hilar, or axillary lymph nodes. Thyroid gland, trachea, and esophagus demonstrate no significant findings. Lungs/Pleura: No airspace disease, effusion, or pneumothorax. Minimal hypoventilatory changes at the lung bases. Central airways are patent. Upper Abdomen: No acute abnormality. Musculoskeletal: No acute or destructive bony lesions. Reconstructed images demonstrate no additional findings. Review of the MIP images confirms the above findings. IMPRESSION: 1. No evidence of pulmonary embolus. 2. No acute intrathoracic process. 3. Mild coronary artery atherosclerosis. Electronically Signed   By: Sharlet Salina M.D.   On: 03/09/2020 02:38    Cardiac Studies  None  Patient Profile  Reginald Gonzalez is a 49 y.o. male with morbid obesity (BMI 51), hypertension, diabetes who was admitted on 03/08/2020 for non-STEMI.  Assessment & Plan   1.   Non-STEMI -Admitted with central sharp chest pain.  Resolved nitroglycerin.  No further episodes of pain. -Troponin up to 475 and still rising. 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 -Overall consistent with ACS. -Continue aspirin, heparin drip.  Given high intensity statin.  Left heart cath today.  N.p.o. now. -I will go ahead and start him on metoprolol tartrate 25 mg twice daily.  There are no signs of heart failure or volume overload on examination.  His blood pressure is normal. 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  We will have to work with the [facility] on this.  We will know more after left heart cath.  He will be out of work I suspect for 3 to 6 months. -A1c pending, LDL 115, we will check  TSH -Echo pending -We will need sleep study at discharge -BMP pending  2.  Hypertension -Metoprolol 25 mg twice daily for now  3.  Morbid obesity, BMI 51 -Needs sleep study.  4.  Diabetes -Reports oral agents at home. -Sliding-scale insulin while here -A1c pending  FEN -IVF pre cath -diet: NPO -dvt ppx: heparin -code: full   For questions or updates, please contact [facility] Please consult [email or web address] for contact info under   Time Spent with Patient: I have spent a total of 35 minutes with patient reviewing hospital notes, telemetry, EKGs, labs and examining the patient as well as establishing an assessment and plan that was discussed with the patient.  > 50% of time was spent in direct patient care.    Signed, Lenna Gilford. Flora Lipps, MD [facility]  [facility]  03/09/2020 7:55 AM

## 2020-03-09 NOTE — ED Notes (Signed)
Cardio provider bedside

## 2020-03-09 NOTE — Interval H&P Note (Signed)
Cath Lab Visit (complete for each Cath Lab visit)  Clinical Evaluation Leading to the Procedure:   ACS: Yes.    Non-ACS:    Anginal Classification: CCS IV  Anti-ischemic medical therapy: Minimal Therapy (1 class of medications)  Non-Invasive Test Results: No non-invasive testing performed  Prior CABG: No previous CABG      History and Physical Interval Note:  03/09/2020 3:42 PM  Reginald Gonzalez  has presented today for surgery, with the diagnosis of nonstemi.  The various methods of treatment have been discussed with the patient and family. After consideration of risks, benefits and other options for treatment, the patient has consented to  Procedure(s): LEFT HEART CATH AND CORONARY ANGIOGRAPHY (N/A) as a surgical intervention.  The patient's history has been reviewed, patient examined, no change in status, stable for surgery.  I have reviewed the patient's chart and labs.  Questions were answered to the patient's satisfaction.     Lance Muss

## 2020-03-09 NOTE — Progress Notes (Signed)
Cardiology Progress Note  Patient ID: Reginald Gonzalez MRN: [id number] DOB: 1971-09-21 Date of Encounter: 03/09/2020  Primary Cardiologist: No primary care provider on file.  Subjective  No further chest pain.  Troponin still rising.  Denies shortness of breath or palpitations.  Telemetry unremarkable.  ROS:  All other ROS reviewed and negative. Pertinent positives noted in the HPI.     Inpatient Medications  Scheduled Meds: . [START ON 03/10/2020] aspirin EC  81 mg Oral Daily  . atorvastatin  80 mg Oral q1800  . metoprolol tartrate  25 mg Oral BID  . potassium chloride SA  40 mEq Oral Daily  . sodium chloride flush  3 mL Intravenous Once   Continuous Infusions: . heparin 1,450 Units/hr (03/09/20 0111)   PRN Meds: acetaminophen, nitroGLYCERIN, ondansetron (ZOFRAN) IV   Vital Signs   Vitals:   03/09/20 0515 03/09/20 0545 03/09/20 0600 03/09/20 0645  BP: 102/62 117/76 (!) 115/59 122/73  Pulse:      Resp:  18 16 (!) 22  Temp:      TempSrc:      SpO2:      Weight:      Height:       No intake or output data in the 24 hours ending 03/09/20 0755 Last 3 Weights 03/08/2020 03/08/2017  Weight (lbs) 286 lb 9.6 oz 265 lb  Weight (kg) 130 kg 120.203 kg      Telemetry  Overnight telemetry shows normal sinus rhythm with heart rate 80-90 bpm, which I personally reviewed.   ECG  The most recent ECG shows normal sinus rhythm, heart rate 79, old anteroseptal infarct, T wave inversions noted anterior lateral leads, which I personally reviewed.   Physical Exam   Vitals:   03/09/20 0515 03/09/20 0545 03/09/20 0600 03/09/20 0645  BP: 102/62 117/76 (!) 115/59 122/73  Pulse:      Resp:  18 16 (!) 22  Temp:      TempSrc:      SpO2:      Weight:      Height:       No intake or output data in the 24 hours ending 03/09/20 0755  Last 3 Weights 03/08/2020 03/08/2017  Weight (lbs) 286 lb 9.6 oz 265 lb  Weight (kg) 130 kg 120.203 kg    Body mass index is 50.77 kg/m.   General: Well  nourished, well developed, in no acute distress Head: Atraumatic, normal size  Eyes: PEERLA, EOMI  Neck: Supple, no JVD Endocrine: No thryomegaly Cardiac: Normal S1, S2; RRR; no murmurs, rubs, or gallops Lungs: Clear to auscultation bilaterally, no wheezing, rhonchi or rales  Abd: Soft, nontender, no hepatomegaly  Ext: No edema, pulses 2+ Musculoskeletal: No deformities, BUE and BLE strength normal and equal Skin: Warm and dry, no rashes   Neuro: Alert and oriented to person, place, time, and situation, CNII-XII grossly intact, no focal deficits  Psych: Normal mood and affect   Labs  High Sensitivity Troponin:   Recent Labs  Lab 03/08/20 1949 03/08/20 2252 03/09/20 0404  TROPONINIHS 154* 250* 475*     Cardiac EnzymesNo results for input(s): TROPONINI in the last 168 hours. No results for input(s): TROPIPOC in the last 168 hours.  Chemistry Recent Labs  Lab 03/08/20 1949 03/09/20 0404  NA 133* 136  K 3.3* 4.4  CL 99 102  CO2 22 22  GLUCOSE 137* 110*  BUN 18 17  CREATININE 1.58* 1.45*  CALCIUM 8.7* 8.8*  GFRNONAA 51* 57*  GFRAA  59* >60  ANIONGAP 12 12    Hematology Recent Labs  Lab 03/08/20 1949 03/09/20 0404  WBC 9.8 8.9  RBC 5.50 5.46  HGB 14.3 14.2  HCT 44.7 44.6  MCV 81.3 81.7  MCH 26.0 26.0  MCHC 32.0 31.8  RDW 14.9 15.1  PLT 286 256   BNPNo results for input(s): BNP, PROBNP in the last 168 hours.  DDimer No results for input(s): DDIMER in the last 168 hours.   Radiology  DG Chest 2 View  Result Date: 03/08/2020 CLINICAL DATA:  Chest pain, central chest pain and shortness of breath for 2 days EXAM: CHEST - 2 VIEW COMPARISON:  None. FINDINGS: Hazy and streaky opacities in the lung bases and more focally within the right middle lobe. No pneumothorax or effusion. No convincing features of edema. The cardiomediastinal contours are unremarkable. No acute osseous or soft tissue abnormality. IMPRESSION: Hazy and streaky opacities in the lung bases and more  focally within the right middle lobe. Favor atelectasis though early infection could have a similar appearance. Electronically Signed   By: Kreg Shropshire M.D.   On: 03/08/2020 20:18   CT Angio Chest PE W and/or Wo Contrast  Result Date: 03/09/2020 CLINICAL DATA:  Shortness of breath, chest pain, elevated troponin, centralized chest pain for 2 days EXAM: CT ANGIOGRAPHY CHEST WITH CONTRAST TECHNIQUE: Multidetector CT imaging of the chest was performed using the standard protocol during bolus administration of intravenous contrast. Multiplanar CT image reconstructions and MIPs were obtained to evaluate the vascular anatomy. CONTRAST:  22mL OMNIPAQUE IOHEXOL 350 MG/ML SOLN COMPARISON:  03/08/2020 FINDINGS: Cardiovascular: This is a technically adequate evaluation of the pulmonary vasculature. No filling defects or pulmonary emboli. The heart is unremarkable without pericardial effusion. Minimal atherosclerosis within the left main coronary artery and LAD distribution. Thoracic aorta is normal, without aneurysm or dissection. Mediastinum/Nodes: No enlarged mediastinal, hilar, or axillary lymph nodes. Thyroid gland, trachea, and esophagus demonstrate no significant findings. Lungs/Pleura: No airspace disease, effusion, or pneumothorax. Minimal hypoventilatory changes at the lung bases. Central airways are patent. Upper Abdomen: No acute abnormality. Musculoskeletal: No acute or destructive bony lesions. Reconstructed images demonstrate no additional findings. Review of the MIP images confirms the above findings. IMPRESSION: 1. No evidence of pulmonary embolus. 2. No acute intrathoracic process. 3. Mild coronary artery atherosclerosis. Electronically Signed   By: Sharlet Salina M.D.   On: 03/09/2020 02:38    Cardiac Studies  None  Patient Profile  Reginald Gonzalez is a 49 y.o. male with morbid obesity (BMI 51), hypertension, diabetes who was admitted on 03/08/2020 for non-STEMI.  Assessment & Plan   1.   Non-STEMI -Admitted with central sharp chest pain.  Resolved nitroglycerin.  No further episodes of pain. -Troponin up to 475 and still rising. 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 -Overall consistent with ACS. -Continue aspirin, heparin drip.  Given high intensity statin.  Left heart cath today.  N.p.o. now. -I will go ahead and start him on metoprolol tartrate 25 mg twice daily.  There are no signs of heart failure or volume overload on examination.  His blood pressure is normal. 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  We will have to work with the [facility] on this.  We will know more after left heart cath.  He will be out of work I suspect for 3 to 6 months. -A1c pending, LDL 115, we will check  TSH -Echo pending -We will need sleep study at discharge -BMP pending  2.  Hypertension -Metoprolol 25 mg twice daily for now  3.  Morbid obesity, BMI 51 -Needs sleep study.  4.  Diabetes -Reports oral agents at home. -Sliding-scale insulin while here -A1c pending  FEN -IVF pre cath -diet: NPO -dvt ppx: heparin -code: full   For questions or updates, please contact [facility] Please consult [email or web address] for contact info under   Time Spent with Patient: I have spent a total of 35 minutes with patient reviewing hospital notes, telemetry, EKGs, labs and examining the patient as well as establishing an assessment and plan that was discussed with the patient.  > 50% of time was spent in direct patient care.    Signed, St. Romin T. O'Neal, MD [place]  [facility]  03/09/2020 7:55 AM   

## 2020-03-09 NOTE — ED Notes (Signed)
Could only get troponin and bmp will ask phlebotomist to draw other labs. RN Victorino Dike made aware

## 2020-03-09 NOTE — Progress Notes (Signed)
Pt received from cath lab. VSS. TR band on right radial. Telemetry applied. Pt oriented to room and unit. Call light in reach.  Versie Starks, RN

## 2020-03-09 NOTE — H&P (Signed)
Cardiology History & Physical    Patient ID: Reginald Gonzalez MRN: [id number], DOB/AGE: 04/24/1971   Admit date: 03/08/2020  Primary Physician: Mirna Mires, MD Primary Cardiologist: No primary care provider on file.  Patient Profile    49 year old male with morbid obesity, hypertension, and diabetes presenting with chest pain and shortness of breath in the setting of elevated troponin.  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  The episodes would occur randomly and he would also feel lightheaded.  He has been having issues with shortness of breath for "a good minute" (~2 months?) that has been limiting his exertional capacity to the point at which he cant walk a flight of stairs without significant dyspnea.  Given the recurrent chest pain and worsening shortness of breath at rest, he decided to present to the ED today.  Currently he is chest pain free but still has some lingering shortness of breath.  He felt very warm and flushed and required frequent positioning changes during my conversation with him.  He doesn't have a pleuritic component to his chest pain.  He's had no recent sick contacts, abdominal pain, nausea, vomiting, early satiety, dysuria, or urinary frequency.  He drives long-distance hauls frequently and has within the last week.  He hasn't noticed asymmetric edema of either of his extremities.  He denies orthopnea, PND, or palptiations at rest.  He's been adherent to his antihypertensives which he reports keep his hypertension at bat.  Past Medical History   PMH Hypertension T2DM not on insulin without known complication Hyperlipidemia Morbid obesity Recurrent urinary tract infections  No prior surgical history  Allergies No Known Allergies  Home  Medications    Prior to Admission medications   Medication Sig Start Date End Date Taking? Authorizing Provider  acetaminophen (TYLENOL) 325 MG tablet Take 650 mg by mouth every 6 (six) hours as needed for mild pain or headache.   Yes [provider]  aspirin (BAYER LOW DOSE) 81 MG EC tablet Take 81-162 mg by mouth as needed for pain (or headaches- swallow whole).    Yes [provider]  atorvastatin (LIPITOR) 20 MG tablet Take 20 mg by mouth at bedtime. 03/06/20  Yes [provider]  clotrimazole-betamethasone (LOTRISONE) cream Apply 1 application topically at bedtime as needed (for imflammation/redness- toes).  02/08/20  Yes [provider]  metFORMIN (GLUCOPHAGE) 500 MG tablet Take 500 mg by mouth daily with breakfast. 02/08/20  Yes [provider]  tadalafil (CIALIS) 20 MG tablet Take 20 mg by mouth daily as needed for erectile dysfunction. 03/05/20  Yes [provider]  valsartan-hydrochlorothiazide (DIOVAN-HCT) 160-12.5 MG tablet Take 1 tablet by mouth in the morning. 02/08/20  Yes [provider]    Family History    Family History  Problem Relation Age of Onset  . Hypertension Mother   . Healthy Father    He indicated that his mother is deceased. He indicated that his father is alive.  No history of premature cardiovascular disease.  Social History    Social History   Socioeconomic History  . Marital status: Married    Spouse name: Not on file  . Number of children: Not on file  . Years of education: Not on file  . Highest  education level: Not on file  Occupational History  . Not on file  Tobacco Use  . Smoking status: Former Games developer  . Smokeless tobacco: Never Used  Substance and Sexual Activity  . Alcohol use: Yes    Comment: occasional  . Drug use: No  . Sexual activity: Not on file  Other Topics Concern  . Not on file  Social History Narrative  . Not on file   Social Determinants of Health    Financial Resource Strain:   . Difficulty of Paying Living Expenses:   Food Insecurity:   . Worried About Programme researcher, broadcasting/film/video in the Last Year:   . Barista in the Last Year:   Transportation Needs:   . Freight forwarder (Medical):   Marland Kitchen Lack of Transportation (Non-Medical):   Physical Activity:   . Days of Exercise per Week:   . Minutes of Exercise per Session:   Stress:   . Feeling of Stress :   Social Connections:   . Frequency of Communication with Friends and Family:   . Frequency of Social Gatherings with Friends and Family:   . Attends Religious Services:   . Active Member of Clubs or Organizations:   . Attends Banker Meetings:   Marland Kitchen Marital Status:   Intimate Partner Violence:   . Fear of Current or Ex-Partner:   . Emotionally Abused:   Marland Kitchen Physically Abused:   . Sexually Abused:      Review of Systems    General:  No chills, fever, night sweats or weight changes.  Cardiovascular: chest pain, dyspnea on exertion,no  edema, orthopnea, palpitations, paroxysmal nocturnal dyspnea. Dermatological: No rash, lesions/masses Respiratory: No cough, dyspnea Urologic: No hematuria, dysuria Abdominal:   No nausea, vomiting, diarrhea, bright red blood per rectum, melena, or hematemesis Neurologic:  No visual changes, wkns, changes in mental status. All other systems reviewed and are otherwise negative except as noted above.  Physical Exam    BP 113/68   Pulse (!) 120   Temp 98.3 F (36.8 C) (Oral)   Resp 16   Ht 5\' 3"  (1.6 m)   Wt 130 kg   SpO2 97%   BMI 50.77 kg/m  General: Alert, NAD HEENT: Normal  Neck: No bruits or JVP apparent though habitus limits exam Lungs:  Resp regular and unlabored, CTA bilaterally. Heart: Regular rhythm, no s3, s4, or murmurs. Abdomen: Soft, non-tender, non-distended, BS +, obese abdomen Extremities: Warm. No clubbing, cyanosis or edema. DP/PT/Radials 2+ and equal bilaterally. Psych: Normal affect. Neuro: Alert and  oriented. No gross focal deficits. No abnormal movements.  Labs  Reviewed.  Remarkable for Na 133, K 3.3, Cr 1.58 from 1.23 Trop 154 --> 250 9.8>14.3<286  Radiology Studies    DG Chest 2 View  Result Date: 03/08/2020 CLINICAL DATA:  Chest pain, central chest pain and shortness of breath for 2 days EXAM: CHEST - 2 VIEW COMPARISON:  None. FINDINGS: Hazy and streaky opacities in the lung bases and more focally within the right middle lobe. No pneumothorax or effusion. No convincing features of edema. The cardiomediastinal contours are unremarkable. No acute osseous or soft tissue abnormality. IMPRESSION: Hazy and streaky opacities in the lung bases and more focally within the right middle lobe. Favor atelectasis though early infection could have a similar appearance. Electronically Signed   By: 03/10/2020 M.D.   On: 03/08/2020 20:18  Personally reviewed.  Agree no edema, some cephalization and peribronchial thickening present.  CTA of  chest without VTE, parenchymal abnormalities, dissection.  ECG & Cardiac Imaging    Presenting EKG reviewed with normal sinus rhythm, normal axis, non-specific inferolateral ST changes. Repeat EKG pending.  Assessment & Plan    49 year old male with history of hypertension, hyperlipidemia, diabetes, and morbid obesity presenting with chest pain and shortness of breath.  He has notable objective findings of non-specific ST changes on EKG and troponin elevation.  Favor T2NSTEMI as cause of his presentation with possible subacute worsening of heart failure (describes ~2 months of worsening exertional dyspnea), though worsening heart failure with stable coronary disease could give a similar clinical picture.  Pulmonary embolism and aortic dissection have been excluded.  He's currently chest pain free.  Problem list NSTEMI CKD3a Hyperlipidemia Morbid obesity T2DM not on insulin therapy History of hypertension Mild hypoalbuminemia Mild hypokalemia  Plan  NSTEMI, GRACE <180 - ASA 325 mg and 81 mg indefinitely - Heparin infusion, ACS nomogram - NPO w/ plan for LHC tomorrow - TTE - No beta blocker with blood pressure 100-110/60-70 - Full TTE in AM - Atoravastatin 80 mg x1 - Risk stratification labs - Cardiac rehabilitation referral   Hyperlipidemia - Atoravastatin 80 mg goal LDL <70  T2DM not on insulin therapy - Hold metformin with intra-arterial contrast - With CKD3, probable ACS, and possible heart failure would recommend substituting SGLT2i at discahrge - SSI while inpatient  Present on admission but not addressed this admisson Morbid obesity History of hypertension Mild hypoalbuminemia  Nutrition: NPO DVT ppx: Therapeutic anticoagulation GI ppx: Pantoprazole Advanced Care Planning: Full code  Signed, Delight Hoh, MD 03/09/2020, 1:32 AM

## 2020-03-09 NOTE — Progress Notes (Signed)
ANTICOAGULATION CONSULT NOTE - Initial Consult  Pharmacy Consult for Heparin Indication: chest pain/ACS  No Known Allergies  Patient Measurements: Height: 5\' 3"  (160 cm) Weight: 130 kg (286 lb 9.6 oz) IBW/kg (Calculated) : 56.9 Heparin Dosing Weight: 90 kg  Vital Signs: BP: 114/65 (04/14 0919) Pulse Rate: 87 (04/14 0919)  Labs: Recent Labs    03/08/20 1949 03/08/20 2252 03/09/20 0404 03/09/20 0800  HGB 14.3  --  14.2  --   HCT 44.7  --  44.6  --   PLT 286  --  256  --   LABPROT 13.0  --  13.8  --   INR 1.0  --  1.1  --   HEPARINUNFRC  --   --   --  0.33  CREATININE 1.58*  --  1.45*  --   TROPONINIHS 154* 250* 475*  --     Estimated Creatinine Clearance: 75.9 mL/min (A) (by C-G formula based on SCr of 1.45 mg/dL (H)).   Medical History: Past Medical History:  Diagnosis Date  . Diabetes mellitus   . Hypertension     Medications:  No current facility-administered medications on file prior to encounter.   Current Outpatient Medications on File Prior to Encounter  Medication Sig Dispense Refill  . acetaminophen (TYLENOL) 325 MG tablet Take 650 mg by mouth every 6 (six) hours as needed for mild pain or headache.    03/11/20 aspirin (BAYER LOW DOSE) 81 MG EC tablet Take 81-162 mg by mouth as needed for pain (or headaches- swallow whole).     Marland Kitchen atorvastatin (LIPITOR) 20 MG tablet Take 20 mg by mouth at bedtime.    . clotrimazole-betamethasone (LOTRISONE) cream Apply 1 application topically at bedtime as needed (for imflammation/redness- toes).     . metFORMIN (GLUCOPHAGE) 500 MG tablet Take 500 mg by mouth daily with breakfast.    . tadalafil (CIALIS) 20 MG tablet Take 20 mg by mouth daily as needed for erectile dysfunction.    . valsartan-hydrochlorothiazide (DIOVAN-HCT) 160-12.5 MG tablet Take 1 tablet by mouth in the morning.    . [DISCONTINUED] amLODipine-valsartan (EXFORGE) 5-160 MG per tablet Take 1 tablet by mouth daily.         Assessment: 49 y.o. male with chest  pain for heparin  Heparin level 0.33 which is therapeutic.  Will obtain 6 hr confirmatory level to make sure this does not reflect the bolus.  Goal of Therapy:  Heparin level 0.3-0.7 units/ml Monitor platelets by anticoagulation protocol: Yes   Plan:  Continue heparin drip at 1450 units/hr Re check heparin level in 6 hours.   52, PharmD, [facility] Clinical Pharmacist Please see AMION for all Pharmacists' Contact Phone Numbers 03/09/2020, 9:34 AM

## 2020-03-10 ENCOUNTER — Other Ambulatory Visit: Payer: Self-pay | Admitting: Physician Assistant

## 2020-03-10 ENCOUNTER — Inpatient Hospital Stay (HOSPITAL_COMMUNITY): Payer: BC Managed Care – PPO

## 2020-03-10 DIAGNOSIS — I517 Cardiomegaly: Secondary | ICD-10-CM | POA: Diagnosis not present

## 2020-03-10 DIAGNOSIS — I422 Other hypertrophic cardiomyopathy: Secondary | ICD-10-CM

## 2020-03-10 DIAGNOSIS — I214 Non-ST elevation (NSTEMI) myocardial infarction: Secondary | ICD-10-CM | POA: Diagnosis not present

## 2020-03-10 LAB — CBC
HCT: 43.6 % (ref 39.0–52.0)
Hemoglobin: 13.9 g/dL (ref 13.0–17.0)
MCH: 26.2 pg (ref 26.0–34.0)
MCHC: 31.9 g/dL (ref 30.0–36.0)
MCV: 82.3 fL (ref 80.0–100.0)
Platelets: 274 10*3/uL (ref 150–400)
RBC: 5.3 MIL/uL (ref 4.22–5.81)
RDW: 15.3 % (ref 11.5–15.5)
WBC: 8.4 10*3/uL (ref 4.0–10.5)
nRBC: 0 % (ref 0.0–0.2)

## 2020-03-10 LAB — HEMOGLOBIN A1C
Hgb A1c MFr Bld: 6.4 % — ABNORMAL HIGH (ref 4.8–5.6)
Mean Plasma Glucose: 137 mg/dL

## 2020-03-10 LAB — BASIC METABOLIC PANEL
Anion gap: 8 (ref 5–15)
BUN: 13 mg/dL (ref 6–20)
CO2: 28 mmol/L (ref 22–32)
Calcium: 9 mg/dL (ref 8.9–10.3)
Chloride: 101 mmol/L (ref 98–111)
Creatinine, Ser: 1.29 mg/dL — ABNORMAL HIGH (ref 0.61–1.24)
GFR calc Af Amer: >60 mL/min (ref >60)
GFR calc non Af Amer: >60 mL/min (ref >60)
Glucose, Bld: 102 mg/dL — ABNORMAL HIGH (ref 70–99)
Potassium: 4.2 mmol/L (ref 3.5–5.1)
Sodium: 137 mmol/L (ref 135–145)

## 2020-03-10 LAB — GLUCOSE, CAPILLARY
Glucose-Capillary: 95 mg/dL (ref 70–99)
Glucose-Capillary: 106 mg/dL — ABNORMAL HIGH (ref 70–99)

## 2020-03-10 LAB — C-REACTIVE PROTEIN: CRP: 0.6 mg/dL (ref <1.0)

## 2020-03-10 LAB — SEDIMENTATION RATE: Sed Rate: 35 mm/hr — ABNORMAL HIGH (ref 0–16)

## 2020-03-10 MED ORDER — AMLODIPINE BESYLATE 5 MG PO TABS: 5.0000 mg | ORAL_TABLET | Freq: Every day | ORAL | 3 refills | Status: DC

## 2020-03-10 MED ORDER — AMLODIPINE BESYLATE 5 MG PO TABS
5.0000 mg | ORAL_TABLET | Freq: Every day | ORAL | Status: DC
  Administered 2020-03-10: 5 mg via ORAL
  Filled 2020-03-10: qty 1

## 2020-03-10 MED ORDER — GADOBUTROL 1 MMOL/ML IV SOLN
12.0000 mL | Freq: Once | INTRAVENOUS | Status: AC | PRN
  Administered 2020-03-10: 12 mL via INTRAVENOUS

## 2020-03-10 NOTE — Discharge Summary (Signed)
Discharge Summary  Patient ID: Reginald Gonzalez MRN: [id number] DOB: 07/02/1971  Admit date: 03/08/2020 Discharge date: 03/10/2020  Primary Care Provider: Mirna MiresHill, Gerald, MD  Primary Cardiologist: Reginald HarpsWesley T O'Neal, MD   Discharge Diagnoses  Principal Problem:   NSTEMI (non-ST elevated myocardial infarction) [facility](HCC) Active Problems:   Diabetes mellitus (HCC)   HTN (hypertension)   HLD (hyperlipidemia)   Morbid obesity with BMI of 50.0-59.9, adult (HCC)   Apical variant hypertrophic cardiomyopathy (HCC)   Allergies No Known Allergies  Diagnostic Studies/Procedures  TTE 03/09/2020 1. Left ventricular ejection fraction, by estimation, is 60 to 65%. The  left ventricle has normal function. The left ventricle has no regional  wall motion abnormalities. Left ventricular diastolic parameters are  consistent with Grade II diastolic  dysfunction (pseudonormalization).  2. Right ventricular systolic function is normal. The right ventricular  size is normal.  3. The mitral valve is normal in structure. No evidence of mitral valve  regurgitation. No evidence of mitral stenosis.  4. The aortic valve is normal in structure. Aortic valve regurgitation is  not visualized. No aortic stenosis is present.   LHC 03/09/2020  Ost RCA lesion is 40% stenosed. No catheter dampening.  Hazy, Mid Cx lesion is 40% stenosed. Cross sectional area by IVUS 6.24 mm. No dissection or thrombus noted.  LV end diastolic pressure is moderately elevated.  There is no aortic valve stenosis.  Catheter induced vasospasm of the ostial left main, which resolved with IC NTG.   Continue aggressive risk factor modification including weight loss and DM control.  Consider MRI to evaluate for myocarditis.    Consider using amlodipine or nitrates to treat spasm.  CMR: apical variant HCM (read not formally done yet but discussed with Dr. Nathaniel Manhris Gonzalez preliminarily.      History of Present Illness   Reginald Gonzalez a 49 y.o.malewith morbid obesity (BMI 51), hypertension, diabetes who was admitted on 03/08/2020 for non-STEMI.   His overall picture was more consistent with demand ischemia in the setting of a diagnosis of apical variant hypertrophic cardiomyopathy.  Hospital Course  Reginald Gonzalez is a 49 y.o. male who was admitted 03/08/2020 with atypical chest pain and found to have mild troponin elevation.    Demand Ischemia 2/2 Apical Variant Hypertrophic Cardiomyopathy: He underwent cardiac catheterization which showed nonobstructive CAD but not explain his elevated troponin. He did have left main vasospasm but likely related to instrumentation. We did pursue a cardiac MRI to rule out myocarditis and he was found to have apical variant hypertrophic cardiomyopathy. EKG consistent with this diagnosis as well.   Apical Variant Hypertrophic Cardiomyopathy: He describes no prior history of this.  The formal read is not done yet but I did discuss his preliminarily with Dr. Awilda Billhris Shuman who will have his reading later tonight.  This was the only explanation for his mild troponin elevation.  He did not have an acute coronary event that required percutaneous coronary intervention. He has no family history of sudden death, he has had no prior syncopal events.  He reports no palpitations or any symptoms from his diagnosis of hypertrophic cardiomyopathy.  He had no evidence of nonsustained ventricular tachycardia on his telemetry here.  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  We will arrange a 1 week follow-up to see us and then we will let him go back to work.  In the interim we will go  ahead and have him pursue a 3-day Zio patch to further exclude any significant arrhythmias.  I really see no need for an exercise treadmill stress test because he had apical variant hypertrophic myopathy and this would not cause a drop in blood pressure.   I did discuss leg enhancement with Dr. Gaynelle Arabian and he felt this would be much less than 15%.  Overall he has no significant risk factor for sudden cardiac death and will likely not need an ICD.  We will talk with him in office about genetic testing as well as screening for his family members.  Prediabetes: A1c 6.4. Needs to continue metformin.   Obesity (BMI 48): Counseled on weight loss. Needs sleep study.   HTN: Started on amlodipine. Was noted to have some left main spasm during catheterization. Could have also contributed.   Consultants: none   Physical Exam/Data:   Vitals:   03/10/20 0742 03/10/20 1113 03/10/20 1527 03/10/20 1528  BP: 105/60 135/73 125/78 125/78  Pulse:  93 83   Resp:  14 18 19   Temp: 98.1 F (36.7 C) 98.2 F (36.8 C) 98.3 F (36.8 C)   TempSrc: Oral Oral Oral   SpO2:  96%    Weight:      Height:         Intake/Output Summary (Last 24 hours) at 03/10/2020 1817 Last data filed at 03/10/2020 1114 Gross per 24 hour  Intake 240 ml  Output 500 ml  Net -260 ml    Last 3 Weights 03/10/2020 03/08/2020 03/08/2017  Weight (lbs) 276 lb 3.2 oz 286 lb 9.6 oz 265 lb  Weight (kg) 125.283 kg 130 kg 120.203 kg    Body mass index is 48.93 kg/m.   General: obese male, NAD Head: Atraumatic, normal size  Eyes: PEERLA, EOMI  Neck: Supple, no JVD Endocrine: No thryomegaly Cardiac: Normal S1, S2; RRR; no murmurs, rubs, or gallops Lungs: Clear to auscultation bilaterally, no wheezing, rhonchi or rales  Abd: Soft, nontender, no hepatomegaly  Ext: No edema, pulses 2+ Musculoskeletal: No deformities, BUE and BLE strength normal and equal Skin: Warm and dry, no rashes   Neuro: Alert and oriented to person, place, time, and situation, CNII-XII grossly intact, no focal deficits  Psych: Normal mood and affect   Labs & Radiologic Studies  CBC Recent Labs    03/09/20 1800 03/10/20 0421  WBC 8.3 8.4  HGB 13.3 13.9  HCT 41.3 43.6  MCV 82.1 82.3  PLT 255 274   Basic  Metabolic Panel Recent Labs    03/12/20 0404 03/09/20 0404 03/09/20 1800 03/10/20 0727  NA 136  --   --  137  K 4.4  --   --  4.2  CL 102  --   --  101  CO2 22  --   --  28  GLUCOSE 110*  --   --  102*  BUN 17  --   --  13  CREATININE 1.45*   < > 1.25* 1.29*  CALCIUM 8.8*  --   --  9.0  MG 1.9  --   --   --    < > = values in this interval not displayed.   Liver Function Tests No results for input(s): AST, ALT, ALKPHOS, BILITOT, PROT, ALBUMIN in the last 72 hours. No results for input(s): LIPASE, AMYLASE in the last 72 hours. Cardiac Enzymes No results for input(s): CKTOTAL, CKMB, CKMBINDEX, TROPONINI in the last 72 hours. BNP Invalid input(s): POCBNP D-Dimer No results for  input(s): DDIMER in the last 72 hours. Hemoglobin A1C Recent Labs    03/09/20 0404  HGBA1C 6.4*   Fasting Lipid Panel Recent Labs    03/09/20 0405  CHOL 165  HDL 37*  LDLCALC 115*  TRIG 67  CHOLHDL 4.5   Thyroid Function Tests Recent Labs    03/09/20 1500  TSH 1.019   _____________  DG Chest 2 View  Result Date: 03/08/2020 CLINICAL DATA:  Chest pain, central chest pain and shortness of breath for 2 days EXAM: CHEST - 2 VIEW COMPARISON:  None. FINDINGS: Hazy and streaky opacities in the lung bases and more focally within the right middle lobe. No pneumothorax or effusion. No convincing features of edema. The cardiomediastinal contours are unremarkable. No acute osseous or soft tissue abnormality. IMPRESSION: Hazy and streaky opacities in the lung bases and more focally within the right middle lobe. Favor atelectasis though early infection could have a similar appearance. Electronically Signed   By: Kreg Shropshire M.D.   On: 03/08/2020 20:18   CT Angio Chest PE W and/or Wo Contrast  Result Date: 03/09/2020 CLINICAL DATA:  Shortness of breath, chest pain, elevated troponin, centralized chest pain for 2 days EXAM: CT ANGIOGRAPHY CHEST WITH CONTRAST TECHNIQUE: Multidetector CT imaging of the chest  was performed using the standard protocol during bolus administration of intravenous contrast. Multiplanar CT image reconstructions and MIPs were obtained to evaluate the vascular anatomy. CONTRAST:  75mL OMNIPAQUE IOHEXOL 350 MG/ML SOLN COMPARISON:  03/08/2020 FINDINGS: Cardiovascular: This is a technically adequate evaluation of the pulmonary vasculature. No filling defects or pulmonary emboli. The heart is unremarkable without pericardial effusion. Minimal atherosclerosis within the left main coronary artery and LAD distribution. Thoracic aorta is normal, without aneurysm or dissection. Mediastinum/Nodes: No enlarged mediastinal, hilar, or axillary lymph nodes. Thyroid gland, trachea, and esophagus demonstrate no significant findings. Lungs/Pleura: No airspace disease, effusion, or pneumothorax. Minimal hypoventilatory changes at the lung bases. Central airways are patent. Upper Abdomen: No acute abnormality. Musculoskeletal: No acute or destructive bony lesions. Reconstructed images demonstrate no additional findings. Review of the MIP images confirms the above findings. IMPRESSION: 1. No evidence of pulmonary embolus. 2. No acute intrathoracic process. 3. Mild coronary artery atherosclerosis. Electronically Signed   By: Sharlet Salina M.D.   On: 03/09/2020 02:38   CARDIAC CATHETERIZATION  Addendum Date: 03/09/2020    Ost RCA lesion is 40% stenosed. No catheter dampening.  Hazy, Mid Cx lesion is 40% stenosed. Cross sectional area by IVUS 6.24 mm. No dissection or thrombus noted.  LV end diastolic pressure is moderately elevated.  There is no aortic valve stenosis.  Catheter induced vasospasm of the ostial left main, which resolved with IC NTG.  Continue aggressive risk factor modification including weight loss and DM control.  Consider MRI to evaluate for myocarditis.  Consider using amlodipine or nitrates to treat spasm.   Result Date: 03/09/2020  Ost RCA lesion is 40% stenosed. No catheter  dampening.  Hazy, Mid Cx lesion is 40% stenosed. Cross sectional area by IVUS 6.24 mm. No dissection or thrombus noted.  LV end diastolic pressure is moderately elevated.  There is no aortic valve stenosis.  Catheter induced vasospasm of the ostial left main, which resolved with IC NTG.  Continue aggressive risk factor modification including weight loss and DM control.  Consider MRI to evaluate for myocarditis.    ECHOCARDIOGRAM COMPLETE  Result Date: 03/09/2020    ECHOCARDIOGRAM REPORT   Patient Name:   CARTHEL CASTILLE Slayden Date of Exam:  03/09/2020 Medical Rec #:  [id number]      Height:       63.0 in Accession #:    [id number]     Weight:       286.6 lb Date of Birth:  1971-11-16      BSA:          2.253 m Patient Age:    26 years       BP:           114/65 mmHg Patient Gender: M              HR:           87 bpm. Exam Location:  Inpatient Procedure: 2D Echo, Cardiac Doppler and Color Doppler Indications:    Acute MI  History:        Patient has no prior history of Echocardiogram examinations.                 NSTEMI, Obesity; Risk Factors:Diabetes.  Sonographer:    Dustin Flock Referring Phys: [id number] [place]  1. Left ventricular ejection fraction, by estimation, is 60 to 65%. The left ventricle has normal function. The left ventricle has no regional wall motion abnormalities. Left ventricular diastolic parameters are consistent with Grade II diastolic dysfunction (pseudonormalization).  2. Right ventricular systolic function is normal. The right ventricular size is normal.  3. The mitral valve is normal in structure. No evidence of mitral valve regurgitation. No evidence of mitral stenosis.  4. The aortic valve is normal in structure. Aortic valve regurgitation is not visualized. No aortic stenosis is present. FINDINGS  Left Ventricle: Left ventricular ejection fraction, by estimation, is 60 to 65%. The left ventricle has normal function. The left ventricle has no regional wall motion  abnormalities. The left ventricular internal cavity size was normal in size. There is  borderline left ventricular hypertrophy. Left ventricular diastolic parameters are consistent with Grade II diastolic dysfunction (pseudonormalization). Right Ventricle: The right ventricular size is normal. No increase in right ventricular wall thickness. Right ventricular systolic function is normal. Left Atrium: Left atrial size was normal in size. Right Atrium: Right atrial size was normal in size. Pericardium: There is no evidence of pericardial effusion. Mitral Valve: The mitral valve is normal in structure. No evidence of mitral valve regurgitation. No evidence of mitral valve stenosis. Tricuspid Valve: The tricuspid valve is normal in structure. Tricuspid valve regurgitation is trivial. Aortic Valve: The aortic valve is normal in structure. Aortic valve regurgitation is not visualized. No aortic stenosis is present. Pulmonic Valve: The pulmonic valve was normal in structure. Pulmonic valve regurgitation is not visualized. No evidence of pulmonic stenosis. Aorta: The aortic root and ascending aorta are structurally normal, with no evidence of dilitation. IAS/Shunts: The atrial septum is grossly normal.  LEFT VENTRICLE PLAX 2D LVIDd:         3.53 cm  Diastology LVIDs:         2.17 cm  LV e' lateral:   6.31 cm/s LV PW:         1.12 cm  LV E/e' lateral: 10.6 LV IVS:        1.17 cm  LV e' medial:    6.42 cm/s LVOT diam:     2.10 cm  LV E/e' medial:  10.4 LV SV:         67 LV SV Index:   30 LVOT Area:     3.46 cm  RIGHT VENTRICLE RV Basal diam:  2.81 cm RV S prime:     17.40 cm/s TAPSE (M-mode): 3.1 cm LEFT ATRIUM             Index       RIGHT ATRIUM           Index LA diam:        3.10 cm 1.38 cm/m  RA Area:     10.90 cm LA Vol (A2C):   42.6 ml 18.91 ml/m RA Volume:   22.60 ml  10.03 ml/m LA Vol (A4C):   48.2 ml 21.39 ml/m LA Biplane Vol: 47.9 ml 21.26 ml/m  AORTIC VALVE LVOT Vmax:   127.00 cm/s LVOT Vmean:  72.500 cm/s  LVOT VTI:    0.193 m  AORTA Ao Root diam: 2.70 cm MITRAL VALVE MV Area (PHT): 5.27 cm    SHUNTS MV Decel Time: 144 msec    Systemic VTI:  0.19 m MV E velocity: 66.90 cm/s  Systemic Diam: 2.10 cm MV A velocity: 61.60 cm/s MV E/A ratio:  1.09 Kristeen Miss MD Electronically signed by Kristeen Miss MD Signature Date/Time: 03/09/2020/11:34:17 AM    Final    Disposition  Pt is being discharged home today in good condition.  Follow-up Plans & Appointments   Follow-up Information    Azalee Course, [place] Follow up on 03/17/2020.   Specialties: Cardiology, Radiology Why: 3:15 pm for [facility] Contact information: [address] [place] [address] [phone]        Sande Rives, MD Follow up on 03/29/2020.   Specialties: Internal Medicine, Cardiology, Radiology Why: 9:00 AM for hospital follow up Contact information: [address] [place] [address] [phone]          Discharge Instructions    Amb Referral to Cardiac Rehabilitation   Complete by: As directed    Diagnosis: NSTEMI   After initial evaluation and assessments completed: Virtual Based Care may be provided alone or in conjunction with Phase 2 Cardiac Rehab based on patient barriers.: Yes   Diet - low sodium heart healthy   Complete by: As directed    Discharge instructions   Complete by: As directed    No driving until after your follow up next week. No lifting over 5 lbs for 1 week. No sexual activity for 1 week. Keep procedure site clean & dry. If you notice increased pain, swelling, bleeding or pus, call/return!  You may shower, but no soaking baths/hot tubs/pools for 1 week.   Increase activity slowly   Complete by: As directed       Discharge Medications   Allergies as of 03/10/2020   No Known Allergies     Medication List    TAKE these medications   acetaminophen 325 MG tablet Commonly known as: TYLENOL Take 650 mg by mouth every 6 (six) hours as needed for mild pain or  headache. Notes to patient: As needed   amLODipine 5 MG tablet Commonly known as: NORVASC Take 1 tablet (5 mg total) by mouth daily. Start taking on: March 11, 2020 Notes to patient: Tomorrow morning 4/16   atorvastatin 20 MG tablet Commonly known as: LIPITOR Take 20 mg by mouth at bedtime. Notes to patient: This evening 4/15   Bayer Low Dose 81 MG EC tablet Generic drug: aspirin Take 81-162 mg by mouth as needed for pain (or headaches- swallow whole). Notes to patient: As needed   clotrimazole-betamethasone cream Commonly known as: LOTRISONE Apply 1 application topically at bedtime as needed (for imflammation/redness-  toes). Notes to patient: As needed    metFORMIN 500 MG tablet Commonly known as: GLUCOPHAGE Take 500 mg by mouth daily with breakfast. Notes to patient: Tomorrow morning 4/16   tadalafil 20 MG tablet Commonly known as: CIALIS Take 20 mg by mouth daily as needed for erectile dysfunction. Notes to patient: As needed    valsartan-hydrochlorothiazide 160-12.5 MG tablet Commonly known as: DIOVAN-HCT Take 1 tablet by mouth in the morning. Notes to patient: Tomorrow morning 4/16        No                               Did the patient have a percutaneous coronary intervention (stent / angioplasty)?:  No.     Outstanding Labs/Studies   He will have outpatient 3 day zio patch.   Duration of Discharge Encounter   Time Spent with Patient: I have spent a total of 35 minutes with patient reviewing hospital notes, telemetry, EKGs, labs and examining the patient as well as establishing an assessment and plan that was discussed with the patient.  > 50% of time was spent in direct patient care.  Signed, Lenna Gilford. Flora Lipps, MD [facility]  [facility]  03/10/2020 6:17 PM

## 2020-03-10 NOTE — Progress Notes (Signed)
Cardiology Progress Note  Patient ID: Reginald Gonzalez MRN: [id number] DOB: 15-Oct-1971 Date of Encounter: 03/10/2020  Primary Cardiologist: No primary care provider on file.  Subjective  Non-obstructive CAD. No symptoms today.   ROS:  All other ROS reviewed and negative. Pertinent positives noted in the HPI.     Inpatient Medications  Scheduled Meds:  amLODipine  5 mg Oral Daily   aspirin EC  81 mg Oral Daily   atorvastatin  20 mg Oral QHS   enoxaparin (LOVENOX) injection  40 mg Subcutaneous Q24H   insulin aspart  0-15 Units Subcutaneous TID WC   insulin aspart  0-5 Units Subcutaneous QHS   potassium chloride SA  40 mEq Oral Daily   sodium chloride flush  3 mL Intravenous Once   sodium chloride flush  3 mL Intravenous Q12H   Continuous Infusions:  sodium chloride     PRN Meds: sodium chloride, acetaminophen, nitroGLYCERIN, ondansetron (ZOFRAN) IV, sodium chloride flush   Vital Signs   Vitals:   03/10/20 0200 03/10/20 0410 03/10/20 0415 03/10/20 0742  BP:  112/74  105/60  Pulse:  76    Resp: 20 20    Temp:  98 F (36.7 C)  98.1 F (36.7 C)  TempSrc:  Oral  Oral  SpO2:  97%    Weight:   125.3 kg   Height:        Intake/Output Summary (Last 24 hours) at 03/10/2020 0838 Last data filed at 03/09/2020 1819 Gross per 24 hour  Intake 879.09 ml  Output 500 ml  Net 379.09 ml   Last 3 Weights 03/10/2020 03/08/2020 03/08/2017  Weight (lbs) 276 lb 3.2 oz 286 lb 9.6 oz 265 lb  Weight (kg) 125.283 kg 130 kg 120.203 kg      Telemetry  Overnight telemetry shows NSR 70s, which I personally reviewed.   ECG  The most recent ECG shows NSR 80 bpm, anterolateral TWI, which I personally reviewed.   Physical Exam   Vitals:   03/10/20 0200 03/10/20 0410 03/10/20 0415 03/10/20 0742  BP:  112/74  105/60  Pulse:  76    Resp: 20 20    Temp:  98 F (36.7 C)  98.1 F (36.7 C)  TempSrc:  Oral  Oral  SpO2:  97%    Weight:   125.3 kg   Height:         Intake/Output  Summary (Last 24 hours) at 03/10/2020 0838 Last data filed at 03/09/2020 1819 Gross per 24 hour  Intake 879.09 ml  Output 500 ml  Net 379.09 ml    Last 3 Weights 03/10/2020 03/08/2020 03/08/2017  Weight (lbs) 276 lb 3.2 oz 286 lb 9.6 oz 265 lb  Weight (kg) 125.283 kg 130 kg 120.203 kg    Body mass index is 48.93 kg/m.   General: obese male, NAD Head: Atraumatic, normal size  Eyes: PEERLA, EOMI  Neck: Supple, no JVD Endocrine: No thryomegaly Cardiac: Normal S1, S2; RRR; no murmurs, rubs, or gallops Lungs: Clear to auscultation bilaterally, no wheezing, rhonchi or rales  Abd: Soft, nontender, no hepatomegaly  Ext: No edema, pulses 2+ Musculoskeletal: No deformities, BUE and BLE strength normal and equal Skin: Warm and dry, no rashes   Neuro: Alert and oriented to person, place, time, and situation, CNII-XII grossly intact, no focal deficits  Psych: Normal mood and affect   Labs  High Sensitivity Troponin:   Recent Labs  Lab 03/08/20 2252 03/09/20 0404 03/09/20 0509 03/09/20 1800 03/09/20 2040  TROPONINIHS  250* 475* 621* 527* 415*     Cardiac EnzymesNo results for input(s): TROPONINI in the last 168 hours. No results for input(s): TROPIPOC in the last 168 hours.  Chemistry Recent Labs  Lab 03/08/20 1949 03/08/20 1949 03/09/20 0404 03/09/20 1800 03/10/20 0727  NA 133*  --  136  --  137  K 3.3*  --  4.4  --  4.2  CL 99  --  102  --  101  CO2 22  --  22  --  28  GLUCOSE 137*  --  110*  --  102*  BUN 18  --  17  --  13  CREATININE 1.58*   < > 1.45* 1.25* 1.29*  CALCIUM 8.7*  --  8.8*  --  9.0  GFRNONAA 51*   < > 57* >60 >60  GFRAA 59*   < > >60 >60 >60  ANIONGAP 12  --  12  --  8   < > = values in this interval not displayed.    Hematology Recent Labs  Lab 03/09/20 0404 03/09/20 1800 03/10/20 0421  WBC 8.9 8.3 8.4  RBC 5.46 5.03 5.30  HGB 14.2 13.3 13.9  HCT 44.6 41.3 43.6  MCV 81.7 82.1 82.3  MCH 26.0 26.4 26.2  MCHC 31.8 32.2 31.9  RDW 15.1 15.2 15.3    PLT 256 255 274   BNP Recent Labs  Lab 03/09/20 0649  BNP 22.5    DDimer No results for input(s): DDIMER in the last 168 hours.   Radiology  DG Chest 2 View  Result Date: 03/08/2020 CLINICAL DATA:  Chest pain, central chest pain and shortness of breath for 2 days EXAM: CHEST - 2 VIEW COMPARISON:  None. FINDINGS: Hazy and streaky opacities in the lung bases and more focally within the right middle lobe. No pneumothorax or effusion. No convincing features of edema. The cardiomediastinal contours are unremarkable. No acute osseous or soft tissue abnormality. IMPRESSION: Hazy and streaky opacities in the lung bases and more focally within the right middle lobe. Favor atelectasis though early infection could have a similar appearance. Electronically Signed   By: Lovena Le M.D.   On: 03/08/2020 20:18   CT Angio Chest PE W and/or Wo Contrast  Result Date: 03/09/2020 CLINICAL DATA:  Shortness of breath, chest pain, elevated troponin, centralized chest pain for 2 days EXAM: CT ANGIOGRAPHY CHEST WITH CONTRAST TECHNIQUE: Multidetector CT imaging of the chest was performed using the standard protocol during bolus administration of intravenous contrast. Multiplanar CT image reconstructions and MIPs were obtained to evaluate the vascular anatomy. CONTRAST:  41m OMNIPAQUE IOHEXOL 350 MG/ML SOLN COMPARISON:  03/08/2020 FINDINGS: Cardiovascular: This is a technically adequate evaluation of the pulmonary vasculature. No filling defects or pulmonary emboli. The heart is unremarkable without pericardial effusion. Minimal atherosclerosis within the left main coronary artery and LAD distribution. Thoracic aorta is normal, without aneurysm or dissection. Mediastinum/Nodes: No enlarged mediastinal, hilar, or axillary lymph nodes. Thyroid gland, trachea, and esophagus demonstrate no significant findings. Lungs/Pleura: No airspace disease, effusion, or pneumothorax. Minimal hypoventilatory changes at the lung bases.  Central airways are patent. Upper Abdomen: No acute abnormality. Musculoskeletal: No acute or destructive bony lesions. Reconstructed images demonstrate no additional findings. Review of the MIP images confirms the above findings. IMPRESSION: 1. No evidence of pulmonary embolus. 2. No acute intrathoracic process. 3. Mild coronary artery atherosclerosis. Electronically Signed   By: MRanda NgoM.D.   On: 03/09/2020 02:38   CARDIAC CATHETERIZATION  Addendum  Date: 03/09/2020    Ost RCA lesion is 40% stenosed. No catheter dampening.  Hazy, Mid Cx lesion is 40% stenosed. Cross sectional area by IVUS 6.24 mm. No dissection or thrombus noted.  LV end diastolic pressure is moderately elevated.  There is no aortic valve stenosis.  Catheter induced vasospasm of the ostial left main, which resolved with IC NTG.  Continue aggressive risk factor modification including weight loss and DM control.  Consider MRI to evaluate for myocarditis.  Consider using amlodipine or nitrates to treat spasm.   Result Date: 03/09/2020  Ost RCA lesion is 40% stenosed. No catheter dampening.  Hazy, Mid Cx lesion is 40% stenosed. Cross sectional area by IVUS 6.24 mm. No dissection or thrombus noted.  LV end diastolic pressure is moderately elevated.  There is no aortic valve stenosis.  Catheter induced vasospasm of the ostial left main, which resolved with IC NTG.  Continue aggressive risk factor modification including weight loss and DM control.  Consider MRI to evaluate for myocarditis.    ECHOCARDIOGRAM COMPLETE  Result Date: 03/09/2020    ECHOCARDIOGRAM REPORT   Patient Name:   Reginald Gonzalez Date of Exam: 03/09/2020 Medical Rec #:  [id number]      Height:       63.0 in Accession #:    [id number]     Weight:       286.6 lb Date of Birth:  Jul 04, 1971      BSA:          2.253 m Patient Age:    49 years       BP:           114/65 mmHg Patient Gender: M              HR:           87 bpm. Exam Location:  Inpatient Procedure: 2D  Echo, Cardiac Doppler and Color Doppler Indications:    Acute MI  History:        Patient has no prior history of Echocardiogram examinations.                 NSTEMI, Obesity; Risk Factors:Diabetes.  Sonographer:    Dustin Flock Referring Phys: [id number] [place]  1. Left ventricular ejection fraction, by estimation, is 60 to 65%. The left ventricle has normal function. The left ventricle has no regional wall motion abnormalities. Left ventricular diastolic parameters are consistent with Grade II diastolic dysfunction (pseudonormalization).  2. Right ventricular systolic function is normal. The right ventricular size is normal.  3. The mitral valve is normal in structure. No evidence of mitral valve regurgitation. No evidence of mitral stenosis.  4. The aortic valve is normal in structure. Aortic valve regurgitation is not visualized. No aortic stenosis is present. FINDINGS  Left Ventricle: Left ventricular ejection fraction, by estimation, is 60 to 65%. The left ventricle has normal function. The left ventricle has no regional wall motion abnormalities. The left ventricular internal cavity size was normal in size. There is  borderline left ventricular hypertrophy. Left ventricular diastolic parameters are consistent with Grade II diastolic dysfunction (pseudonormalization). Right Ventricle: The right ventricular size is normal. No increase in right ventricular wall thickness. Right ventricular systolic function is normal. Left Atrium: Left atrial size was normal in size. Right Atrium: Right atrial size was normal in size. Pericardium: There is no evidence of pericardial effusion. Mitral Valve: The mitral valve is normal in structure. No evidence of mitral valve regurgitation. No  evidence of mitral valve stenosis. Tricuspid Valve: The tricuspid valve is normal in structure. Tricuspid valve regurgitation is trivial. Aortic Valve: The aortic valve is normal in structure. Aortic valve regurgitation  is not visualized. No aortic stenosis is present. Pulmonic Valve: The pulmonic valve was normal in structure. Pulmonic valve regurgitation is not visualized. No evidence of pulmonic stenosis. Aorta: The aortic root and ascending aorta are structurally normal, with no evidence of dilitation. IAS/Shunts: The atrial septum is grossly normal.  LEFT VENTRICLE PLAX 2D LVIDd:         3.53 cm  Diastology LVIDs:         2.17 cm  LV e' lateral:   6.31 cm/s LV PW:         1.12 cm  LV E/e' lateral: 10.6 LV IVS:        1.17 cm  LV e' medial:    6.42 cm/s LVOT diam:     2.10 cm  LV E/e' medial:  10.4 LV SV:         67 LV SV Index:   30 LVOT Area:     3.46 cm  RIGHT VENTRICLE RV Basal diam:  2.81 cm RV S prime:     17.40 cm/s TAPSE (M-mode): 3.1 cm LEFT ATRIUM             Index       RIGHT ATRIUM           Index LA diam:        3.10 cm 1.38 cm/m  RA Area:     10.90 cm LA Vol (A2C):   42.6 ml 18.91 ml/m RA Volume:   22.60 ml  10.03 ml/m LA Vol (A4C):   48.2 ml 21.39 ml/m LA Biplane Vol: 47.9 ml 21.26 ml/m  AORTIC VALVE LVOT Vmax:   127.00 cm/s LVOT Vmean:  72.500 cm/s LVOT VTI:    0.193 m  AORTA Ao Root diam: 2.70 cm MITRAL VALVE MV Area (PHT): 5.27 cm    SHUNTS MV Decel Time: 144 msec    Systemic VTI:  0.19 m MV E velocity: 66.90 cm/s  Systemic Diam: 2.10 cm MV A velocity: 61.60 cm/s MV E/A ratio:  1.09 Mertie Moores MD Electronically signed by Mertie Moores MD Signature Date/Time: 03/09/2020/11:34:17 AM    Final     Cardiac Studies   TTE 03/09/2020 1. Left ventricular ejection fraction, by estimation, is 60 to 65%. The  left ventricle has normal function. The left ventricle has no regional  wall motion abnormalities. Left ventricular diastolic parameters are  consistent with Grade II diastolic  dysfunction (pseudonormalization).  2. Right ventricular systolic function is normal. The right ventricular  size is normal.  3. The mitral valve is normal in structure. No evidence of mitral valve  regurgitation. No  evidence of mitral stenosis.  4. The aortic valve is normal in structure. Aortic valve regurgitation is  not visualized. No aortic stenosis is present.   LHC 03/09/2020  Ost RCA lesion is 40% stenosed. No catheter dampening.  Hazy, Mid Cx lesion is 40% stenosed. Cross sectional area by IVUS 6.24 mm. No dissection or thrombus noted.  LV end diastolic pressure is moderately elevated.  There is no aortic valve stenosis.  Catheter induced vasospasm of the ostial left main, which resolved with IC NTG.   Continue aggressive risk factor modification including weight loss and DM control.  Consider MRI to evaluate for myocarditis.    Consider using amlodipine or nitrates to treat spasm.  Patient Profile  Reginald Gonzalez is a 49 y.o. male with morbid obesity (BMI 15), hypertension, diabetes who was admitted on 03/08/2020 for non-STEMI.   Assessment & Plan   1. NSTEMI/Vasospasm?/Myocarditis -admitted with central sharp chest pain that resolved with NTG -troponin peaked 621 and trending down -non-obstructive CAD on LHC. Did have LM vasospasm.  -EKG with anterolateral TWI -CT PE negative  -CRP negative, ESR pending  -tele without arrhythmia  -A1c 6.4, TSH normal -LDL 115 -Echo with normal LVEF and no WMA -Unclear etiology. Possible vasospasm vs myocarditis. 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  -started norvasc in interim   2. HTN - norvasc as above  3. Pre-diabetes - metformin on discharge  4. BMI 51 -will need sleep study on discharge   FEN - no IVF - diet: regular - dvt ppx: lovenox  - code: full - disposition: possible DC this afternoon   For questions or updates, please contact [facility] Please consult [email or web address] for contact info under   Time Spent with Patient: I have spent a total of 25 minutes with patient reviewing hospital notes, telemetry, EKGs, labs and examining the patient as well as establishing an assessment and plan  that was discussed with the patient.  > 50% of time was spent in direct patient care.    Signed, Addison Naegeli. Audie Box, [place]  03/10/2020 8:38 AM

## 2020-03-10 NOTE — Progress Notes (Signed)
D/C instructions given to patient. Medications and wound care reviewed. All questions answered. IV removed, clean and intact.   Versie Starks, RN

## 2020-03-10 NOTE — Progress Notes (Signed)
CARDIAC REHAB PHASE I   PRE:  Rate/Rhythm: 83 SR  BP:  Supine:   Sitting: 150/75 Lower arm BP Standing:    SaO2:   MODE:  Ambulation: 470 ft   POST:  Rate/Rhythm: 113 ST  BP:  Supine:   Sitting: 118/89 Upper arm BP Standing:    SaO2: 96%RA 1020-1115 Pt walked 470 ft on RA with steady gait and tolerated well. No CP. MI education completed with pt who voiced understanding. Reviewed MI restrictions, NTG use, heart healthy and low carb food choices, walking for ex and CRP 2. 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 Pt also interested in weight loss. Referred to CRP 2 [place]. Pt interested in Virtual since he is on the road for long stretches of time.  Pt is interested in participating in Virtual Cardiac and Pulmonary Rehab. Pt advised that Virtual Cardiac and Pulmonary Rehab is provided at no cost to the patient.  Checklist:  1. Pt has smart device  ie smartphone and/or ipad for downloading an app  Yes 2. Reliable internet/wifi service    Yes 3. Understands how to use their smartphone and navigate within an app.  Yes   Pt verbalized understanding and is in agreement.   Luetta Nutting, RN BSN  03/10/2020 11:08 AM

## 2020-03-11 ENCOUNTER — Telehealth: Payer: Self-pay | Admitting: Radiology

## 2020-03-11 ENCOUNTER — Other Ambulatory Visit: Payer: Self-pay

## 2020-03-11 ENCOUNTER — Telehealth (HOSPITAL_COMMUNITY): Payer: Self-pay

## 2020-03-11 DIAGNOSIS — I214 Non-ST elevation (NSTEMI) myocardial infarction: Secondary | ICD-10-CM

## 2020-03-11 NOTE — Telephone Encounter (Signed)
Enrolled patient for a 3 day Zio monitor to be mailed to patients home. Brief instructions were gone over with the patient and he knows to expect the monitor to arrive in 3-4 days.

## 2020-03-11 NOTE — Telephone Encounter (Signed)
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  Closed referral

## 2020-03-15 ENCOUNTER — Other Ambulatory Visit (INDEPENDENT_AMBULATORY_CARE_PROVIDER_SITE_OTHER): Payer: BC Managed Care – PPO

## 2020-03-15 DIAGNOSIS — I422 Other hypertrophic cardiomyopathy: Secondary | ICD-10-CM | POA: Diagnosis not present

## 2020-03-15 DIAGNOSIS — I214 Non-ST elevation (NSTEMI) myocardial infarction: Secondary | ICD-10-CM

## 2020-03-17 ENCOUNTER — Encounter: Payer: Self-pay | Admitting: Physician Assistant

## 2020-03-17 ENCOUNTER — Other Ambulatory Visit: Payer: Self-pay

## 2020-03-17 ENCOUNTER — Ambulatory Visit: Payer: BC Managed Care – PPO | Admitting: Physician Assistant

## 2020-03-17 VITALS — BP 126/60 | HR 87 | Ht 64.0 in | Wt 274.4 lb

## 2020-03-17 DIAGNOSIS — E785 Hyperlipidemia, unspecified: Secondary | ICD-10-CM

## 2020-03-17 DIAGNOSIS — E119 Type 2 diabetes mellitus without complications: Secondary | ICD-10-CM

## 2020-03-17 DIAGNOSIS — I2510 Atherosclerotic heart disease of native coronary artery without angina pectoris: Secondary | ICD-10-CM

## 2020-03-17 DIAGNOSIS — I422 Other hypertrophic cardiomyopathy: Secondary | ICD-10-CM

## 2020-03-17 DIAGNOSIS — I214 Non-ST elevation (NSTEMI) myocardial infarction: Secondary | ICD-10-CM

## 2020-03-17 DIAGNOSIS — I10 Essential (primary) hypertension: Secondary | ICD-10-CM

## 2020-03-17 NOTE — Progress Notes (Signed)
Cardiology Office Note:    Date:  03/19/2020   ID:  Reginald Gonzalez, DOB 1971/11/21, MRN [id number]  PCP:  Iona Beard, MD  Cardiologist:  Evalina Field, MD  Electrophysiologist:  None   Referring MD: Iona Beard, MD   Chief Complaint  Patient presents with  . Follow-up    seen for Dr. Audie Box    History of Present Illness:    Reginald Gonzalez is a 49 y.o. male with a hx of morbid obesity, HTN, and DM2 who recently presented to the hospital on 03/09/2020 with chest pain.  He was ruled in for NSTEMI.  Troponin peaked at the 621.  Echocardiogram obtained on 03/09/2020 showed EF 60 to 65%, grade 2 DD, no significant valve issue.  Cardiac catheterization performed on 03/09/2020 showed 40% ostial RCA lesion, 40% mid left circumflex lesion, no dissection or thrombus was noted via IVUS, there was catheter induced vasospasm of ostial left main resolved with nitroglycerin.  CT angiogram of the chest obtained during the hospitalization was negative for PE.  There was some thought about possible vasospasm versus myocarditis.  Cardiac MRI obtained on 03/10/2020 showed EF 67%, patchy late gadolinium enhancement predominantly in the lower LV apex consistent with HCM, asymmetric LV hypertrophy most pronounced in the apex and apical lateral wall consistent with apical variant hypertrophic cardiomyopathy.  3-day outpatient Zio monitor was recommended.  He presents today for cardiology office visit. He denies any further chest pain. Heart monitor result is not available as he is still wearing it. I will discuss with Dr. Audie Box regarding genetic testing for him. He is not aware of any sudden cardiac death running in the family. Since his nonobstructive CAD is likely advanced in relation to his age, we will need to control his cholesterol very well. I recommended FLP and LFT in 6-8 weeks. Otherwise, he denies any SOB, LE edema, orthopnea and PND.    Past Medical History:  Diagnosis Date  . Diabetes mellitus   .  Hypertension     Past Surgical History:  Procedure Laterality Date  . INTRAVASCULAR ULTRASOUND/IVUS N/A 03/09/2020   Procedure: Intravascular Ultrasound/IVUS;  Surgeon: Jettie Booze, MD;  Location: [facility];  Service: Cardiovascular;  Laterality: N/A;  . LEFT HEART CATH AND CORONARY ANGIOGRAPHY N/A 03/09/2020   Procedure: LEFT HEART CATH AND CORONARY ANGIOGRAPHY;  Surgeon: Jettie Booze, MD;  Location: [facility];  Service: Cardiovascular;  Laterality: N/A;    Current Medications: Current Meds  Medication Sig  . acetaminophen (TYLENOL) 325 MG tablet Take 650 mg by mouth every 6 (six) hours as needed for mild pain or headache.  Marland Kitchen amLODipine (NORVASC) 5 MG tablet Take 1 tablet (5 mg total) by mouth daily.  Marland Kitchen aspirin (BAYER LOW DOSE) 81 MG EC tablet Take 81-162 mg by mouth as needed for pain (or headaches- swallow whole).   Marland Kitchen atorvastatin (LIPITOR) 20 MG tablet Take 20 mg by mouth at bedtime.  . clotrimazole-betamethasone (LOTRISONE) cream Apply 1 application topically at bedtime as needed (for imflammation/redness- toes).   . metFORMIN (GLUCOPHAGE) 500 MG tablet Take 500 mg by mouth daily with breakfast.  . tadalafil (CIALIS) 20 MG tablet Take 20 mg by mouth daily as needed for erectile dysfunction.  . valsartan-hydrochlorothiazide (DIOVAN-HCT) 160-12.5 MG tablet Take 1 tablet by mouth in the morning.     Allergies:   Patient has no known allergies.   Social History   Socioeconomic History  . Marital status: Married  Spouse name: Not on file  . Number of children: Not on file  . Years of education: Not on file  . Highest education level: Not on file  Occupational History  . Not on file  Tobacco Use  . Smoking status: Former Games developer  . Smokeless tobacco: Never Used  Substance and Sexual Activity  . Alcohol use: Yes    Comment: occasional  . Drug use: No  . Sexual activity: Not on file  Other Topics Concern  . Not on file  Social History  Narrative  . Not on file   Social Determinants of Health   Financial Resource Strain:   . Difficulty of Paying Living Expenses:   Food Insecurity:   . Worried About Programme researcher, broadcasting/film/video in the Last Year:   . Barista in the Last Year:   Transportation Needs:   . Freight forwarder (Medical):   Marland Kitchen Lack of Transportation (Non-Medical):   Physical Activity:   . Days of Exercise per Week:   . Minutes of Exercise per Session:   Stress:   . Feeling of Stress :   Social Connections:   . Frequency of Communication with Friends and Family:   . Frequency of Social Gatherings with Friends and Family:   . Attends Religious Services:   . Active Member of Clubs or Organizations:   . Attends Banker Meetings:   Marland Kitchen Marital Status:      Family History: The patient's family history includes Healthy in his father; Hypertension in his mother.  ROS:   Please see the history of present illness.     All other systems reviewed and are negative.  EKGs/Labs/Other Studies Reviewed:    The following studies were reviewed today:  Echo 03/09/2020 1. Left ventricular ejection fraction, by estimation, is 60 to 65%. The  left ventricle has normal function. The left ventricle has no regional  wall motion abnormalities. Left ventricular diastolic parameters are  consistent with Grade II diastolic  dysfunction (pseudonormalization).  2. Right ventricular systolic function is normal. The right ventricular  size is normal.  3. The mitral valve is normal in structure. No evidence of mitral valve  regurgitation. No evidence of mitral stenosis.  4. The aortic valve is normal in structure. Aortic valve regurgitation is  not visualized. No aortic stenosis is present.   Cath 03/09/2020  Ost RCA lesion is 40% stenosed. No catheter dampening.  Hazy, Mid Cx lesion is 40% stenosed. Cross sectional area by IVUS 6.24 mm. No dissection or thrombus noted.  LV end diastolic pressure is  moderately elevated.  There is no aortic valve stenosis.  Catheter induced vasospasm of the ostial left main, which resolved with IC NTG.   Continue aggressive risk factor modification including weight loss and DM control.  Consider MRI to evaluate for myocarditis.    Consider using amlodipine or nitrates to treat spasm.   EKG:  EKG is ordered today.  The ekg ordered today demonstrates NSR, LVH with repol  Recent Labs: 03/09/2020: B Natriuretic Peptide 22.5; Magnesium 1.9; TSH 1.019 03/10/2020: BUN 13; Creatinine, Ser 1.29; Hemoglobin 13.9; Platelets 274; Potassium 4.2; Sodium 137  Recent Lipid Panel    Component Value Date/Time   CHOL 165 03/09/2020 0405   TRIG 67 03/09/2020 0405   HDL 37 (L) 03/09/2020 0405   CHOLHDL 4.5 03/09/2020 0405   VLDL 13 03/09/2020 0405   LDLCALC 115 (H) 03/09/2020 0405    Physical Exam:    VS:  BP 126/60   Pulse 87   Ht 5\' 4"  (1.626 m)   Wt 274 lb 6.4 oz (124.5 kg)   BMI 47.10 kg/m     Wt Readings from Last 3 Encounters:  03/17/20 274 lb 6.4 oz (124.5 kg)  03/10/20 276 lb 3.2 oz (125.3 kg)  03/08/17 265 lb (120.2 kg)     GEN:  Well nourished, well developed in no acute distress HEENT: Normal NECK: No JVD; No carotid bruits LYMPHATICS: No lymphadenopathy CARDIAC: RRR, no murmurs, rubs, gallops RESPIRATORY:  Clear to auscultation without rales, wheezing or rhonchi  ABDOMEN: Soft, non-tender, non-distended MUSCULOSKELETAL:  No edema; No deformity  SKIN: Warm and dry NEUROLOGIC:  Alert and oriented x 3 PSYCHIATRIC:  Normal affect   ASSESSMENT:    1. Coronary artery disease involving native coronary artery of native heart without angina pectoris   2. Apical variant hypertrophic cardiomyopathy (HCC)   3. Essential hypertension   4. Hyperlipidemia LDL goal <70   5. Controlled type 2 diabetes mellitus without complication, without long-term current use of insulin (HCC)    PLAN:    In order of problems listed above:  1. CAD:  nonobstructive on cath, recent NSTEMI maybe related to coronary spasm as he had catheter induced spasm in left main. Will need to aggressively control risk factor.   - continue amlodipine for possible spasm. Continue aspirin  2. Apical variant hypertrophic cardiomyopathy: seen on recent echo. Will discuss with Dr. 03/10/17 to see if he recommend genetic testing or counseling   3. HTN: BP well controlled  4. HLD: continue on lipitor. FLP and LFT in 6-8 weeks  5. DM II: on metformin, managed by PCP   Medication Adjustments/Labs and Tests Ordered: Current medicines are reviewed at length with the patient today.  Concerns regarding medicines are outlined above.  Orders Placed This Encounter  Procedures  . Lipid panel  . Hepatic function panel  . EKG 12-Lead   No orders of the defined types were placed in this encounter.   Patient Instructions  Medication Instructions:  NO CHANGE *If you need a refill on your cardiac medications before your next appointment, please call your pharmacy*   Lab Work: Your physician recommends that you return for lab work in: 6-8 WEEKS PRIOR TO EATING  If you have labs (blood work) drawn today and your tests are completely normal, you will receive your results only by: Flora Lipps MyChart Message (if you have MyChart) OR . A paper copy in the mail If you have any lab test that is abnormal or we need to change your treatment, we will call you to review the results.   Follow-Up: At [facility], you and your health needs are our priority.  As part of our continuing mission to provide you with exceptional heart care, we have created designated Provider Care Teams.  These Care Teams include your primary Cardiologist (physician) and Advanced Practice Providers (APPs -  Physician Assistants and Nurse Practitioners) who all work together to provide you with the care you need, when you need it.  We recommend signing up for the patient portal called "MyChart".  Sign up  information is provided on this After Visit Summary.  MyChart is used to connect with patients for Virtual Visits (Telemedicine).  Patients are able to view lab/test results, encounter notes, upcoming appointments, etc.  Non-urgent messages can be sent to your provider as well.   To learn more about what you can do with MyChart, go to [facility].  Your next appointment:   3 month(s)  The format for your next appointment:   In Person  Provider:   You may see Reatha Harps, MD or one of the following Advanced Practice Providers on your designated Care Team:    Azalee Course, PA-C  Micah Flesher, PA-C or   Judy Pimple, PA-C        Signed, [place], [place]  03/19/2020 10:23 PM    [facility]

## 2020-03-17 NOTE — Patient Instructions (Signed)
Medication Instructions:  NO CHANGE *If you need a refill on your cardiac medications before your next appointment, please call your pharmacy*   Lab Work: Your physician recommends that you return for lab work in: 6-8 WEEKS PRIOR TO EATING  If you have labs (blood work) drawn today and your tests are completely normal, you will receive your results only by: Marland Kitchen MyChart Message (if you have MyChart) OR . A paper copy in the mail If you have any lab test that is abnormal or we need to change your treatment, we will call you to review the results.   Follow-Up: At [facility], you and your health needs are our priority.  As part of our continuing mission to provide you with exceptional heart care, we have created designated Provider Care Teams.  These Care Teams include your primary Cardiologist (physician) and Advanced Practice Providers (APPs -  Physician Assistants and Nurse Practitioners) who all work together to provide you with the care you need, when you need it.  We recommend signing up for the patient portal called "MyChart".  Sign up information is provided on this After Visit Summary.  MyChart is used to connect with patients for Virtual Visits (Telemedicine).  Patients are able to view lab/test results, encounter notes, upcoming appointments, etc.  Non-urgent messages can be sent to your provider as well.   To learn more about what you can do with MyChart, go to [email or web address].    Your next appointment:   3 month(s)  The format for your next appointment:   In Person  Provider:   You may see Reatha Harps, MD or one of the following Advanced Practice Providers on your designated Care Team:    Azalee Course, PA-C  Micah Flesher, PA-C or   Judy Pimple, [place]

## 2020-03-17 NOTE — Progress Notes (Signed)
   Primary Cardiologist: Reatha Harps, MD  [name] (DOB [birth year]) was recently admitted to [facility] on the 4/13 and discharged on 4/15. Due to the intravascular procedure he received during the recent hospitalization, we request him to hold off on working for 1 week. Patient was seen in the cardiology office on 03/17/2020, at which time he was doing well. 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   [place], [place] 03/17/2020, 4:02 PM

## 2020-03-19 ENCOUNTER — Encounter: Payer: Self-pay | Admitting: Physician Assistant

## 2020-03-29 ENCOUNTER — Ambulatory Visit: Payer: BC Managed Care – PPO | Admitting: Cardiovascular Disease

## 2020-03-31 ENCOUNTER — Other Ambulatory Visit: Payer: Self-pay | Admitting: Cardiovascular Disease

## 2020-03-31 DIAGNOSIS — I214 Non-ST elevation (NSTEMI) myocardial infarction: Secondary | ICD-10-CM

## 2020-03-31 DIAGNOSIS — I422 Other hypertrophic cardiomyopathy: Secondary | ICD-10-CM

## 2020-06-19 NOTE — Progress Notes (Signed)
Cardiology Office Note:   Date:  06/20/2020  NAME:  Reginald Gonzalez    MRN: [id number] DOB:  Apr 18, 1971   PCP:  Mirna Mires, MD  Cardiologist:  Reatha Harps, MD   Referring MD: Mirna Mires, MD   Chief Complaint  Patient presents with  . Cardiomyopathy   History of Present Illness:   Reginald Gonzalez is a 49 y.o. male with a hx of apical HCM, HTN, DM, obesity (BMI 51), non-obstructive CAD who presents for follow-up. He was admitted 02/2020 for CP and NSTEMI. Found to have non-obstructive CAD with possible vasospasm. CMR found apical variant HCM. Recent monitor with no VT. Here to discuss follow-up and genetic testing.   He reports he is doing well.  He reports infrequent episodes of chest pain.  He may get some shortness of breath when he exerts himself once a month but has no major symptoms.  Apparently the symptoms improved with Tylenol.  He is tolerating his aspirin and Lipitor well.  Blood pressure is 132/90.  His weight seems to be a struggle.  Weight is 282 today.  We have talked about reducing his soda intake as well as cutting back on carbohydrates.  This will help him lose weight.  We do need a repeat lipid profile he is not fasting today.  We will plan to check on the next few weeks.  We did discuss his diagnosis of apical variant hypertrophic cardiomyopathy.  I have recommended genetic testing.  He reports he would like to think about this.  He does have a daughter and 2 brothers.  He also has a father who is living.  They all need to be screened with at least echocardiograms.  We did discuss that if he has a genetic defect we can just screen the family members with the genetic test.  He will think about this and we will discuss at her next visit.  He denies chest pain, shortness of breath or palpitations today.  We did discuss increasing his activity level.  150 minutes/week of moderate to vigorous physical activity would be ideal for heart healthy regimen.  Problem List 1. Apical  Variant Hypertrophic CM -3 day zio negative for VT -no syncope -no FH SCD -9% LGE 2. Non-obstructive CAD/NSTEMI -02/2020: 40% RCA, 50% mid LCX -vasospasm noticed during cath 3. DM -A1c 6.4 4. HTN 5. HLD -T chol 165, HDL 37, LDL 115, TG 67 6. Obesity (BMI 48)   Past Medical History: Past Medical History:  Diagnosis Date  . Diabetes mellitus   . Hypertension     Past Surgical History: Past Surgical History:  Procedure Laterality Date  . INTRAVASCULAR ULTRASOUND/IVUS N/A 03/09/2020   Procedure: Intravascular Ultrasound/IVUS;  Surgeon: Corky Crafts, MD;  Location: [facility] INVASIVE CV LAB;  Service: Cardiovascular;  Laterality: N/A;  . LEFT HEART CATH AND CORONARY ANGIOGRAPHY N/A 03/09/2020   Procedure: LEFT HEART CATH AND CORONARY ANGIOGRAPHY;  Surgeon: Corky Crafts, MD;  Location: [facility] INVASIVE CV LAB;  Service: Cardiovascular;  Laterality: N/A;    Current Medications: Current Meds  Medication Sig  . acetaminophen (TYLENOL) 325 MG tablet Take 650 mg by mouth every 6 (six) hours as needed for mild pain or headache.  Marland Kitchen amLODipine (NORVASC) 5 MG tablet Take 1 tablet (5 mg total) by mouth daily.  Marland Kitchen atorvastatin (LIPITOR) 20 MG tablet Take 1 tablet (20 mg total) by mouth at bedtime.  . clotrimazole-betamethasone (LOTRISONE) cream Apply 1 application topically at bedtime as needed (for imflammation/redness-  toes).   . metFORMIN (GLUCOPHAGE) 500 MG tablet Take 1 tablet (500 mg total) by mouth daily with breakfast.  . tadalafil (CIALIS) 20 MG tablet Take 20 mg by mouth daily as needed for erectile dysfunction.  . valsartan-hydrochlorothiazide (DIOVAN-HCT) 160-12.5 MG tablet Take 1 tablet by mouth in the morning.  . [DISCONTINUED] amLODipine (NORVASC) 5 MG tablet Take 1 tablet (5 mg total) by mouth daily.  . [DISCONTINUED] aspirin (BAYER LOW DOSE) 81 MG EC tablet Take 81-162 mg by mouth as needed for pain (or headaches- swallow whole).   . [DISCONTINUED] atorvastatin (LIPITOR)  20 MG tablet Take 20 mg by mouth at bedtime.  . [DISCONTINUED] metFORMIN (GLUCOPHAGE) 500 MG tablet Take 500 mg by mouth daily with breakfast.  . [DISCONTINUED] valsartan-hydrochlorothiazide (DIOVAN-HCT) 160-12.5 MG tablet Take 1 tablet by mouth in the morning.     Allergies:    Patient has no known allergies.   Social History: Social History   Socioeconomic History  . Marital status: Married    Spouse name: Not on file  . Number of children: Not on file  . Years of education: Not on file  . Highest education level: Not on file  Occupational History  . Not on file  Tobacco Use  . Smoking status: Former Games developer  . Smokeless tobacco: Never Used  Vaping Use  . Vaping Use: Never used  Substance and Sexual Activity  . Alcohol use: Yes    Comment: occasional  . Drug use: No  . Sexual activity: Not on file  Other Topics Concern  . Not on file  Social History Narrative  . Not on file   Social Determinants of Health   Financial Resource Strain:   . Difficulty of Paying Living Expenses:   Food Insecurity:   . Worried About Programme researcher, broadcasting/film/video in the Last Year:   . Barista in the Last Year:   Transportation Needs:   . Freight forwarder (Medical):   Marland Kitchen Lack of Transportation (Non-Medical):   Physical Activity:   . Days of Exercise per Week:   . Minutes of Exercise per Session:   Stress:   . Feeling of Stress :   Social Connections:   . Frequency of Communication with Friends and Family:   . Frequency of Social Gatherings with Friends and Family:   . Attends Religious Services:   . Active Member of Clubs or Organizations:   . Attends Banker Meetings:   Marland Kitchen Marital Status:      Family History: The patient's family history includes Healthy in his father; Hypertension in his mother.  ROS:   All other ROS reviewed and negative. Pertinent positives noted in the HPI.     EKGs/Labs/Other Studies Reviewed:   The following studies were personally  reviewed by me today:  CMR 03/10/2020 IMPRESSION: 1. Asymmetric LV hypertrophy, most pronounced in apex with apical lateral wall measuring up to 58mm (basal posterior wall 42mm). This is consistent with apical variant hypertrophic cardiomyopathy  2. Patchy late gadolinium enhancement predominantly at LV apex, consistent with HCM. LGE accounts for 9% of total myocardial mass  3.   No evidence of myocarditis with normal native T1,T2, and ECV  4.   Normal LV size with hyperdynamic systolic function (EF 73%)  5.   Normal RV size and systolic function (EF 67%)  TTE 03/09/2020 1. Left ventricular ejection fraction, by estimation, is 60 to 65%. The  left ventricle has normal function. The left ventricle has  no regional  wall motion abnormalities. Left ventricular diastolic parameters are  consistent with Grade II diastolic  dysfunction (pseudonormalization).  2. Right ventricular systolic function is normal. The right ventricular  size is normal.  3. The mitral valve is normal in structure. No evidence of mitral valve  regurgitation. No evidence of mitral stenosis.  4. The aortic valve is normal in structure. Aortic valve regurgitation is  not visualized. No aortic stenosis is present.   LHC 03/09/2020  Ost RCA lesion is 40% stenosed. No catheter dampening.  Hazy, Mid Cx lesion is 40% stenosed. Cross sectional area by IVUS 6.24 mm. No dissection or thrombus noted.  LV end diastolic pressure is moderately elevated.  There is no aortic valve stenosis.  Catheter induced vasospasm of the ostial left main, which resolved with IC NTG.   Continue aggressive risk factor modification including weight loss and DM control.  Consider MRI to evaluate for myocarditis.    Consider using amlodipine or nitrates to treat spasm.  3 day Zio   1. No evidence of ventricular tachycardia.  2. Occasional PACs (1.2% burden).  3. No atrial fibrillation.         Recent Labs: 03/09/2020:  B Natriuretic Peptide 22.5; Magnesium 1.9; TSH 1.019 03/10/2020: BUN 13; Creatinine, Ser 1.29; Hemoglobin 13.9; Platelets 274; Potassium 4.2; Sodium 137   Recent Lipid Panel    Component Value Date/Time   CHOL 165 03/09/2020 0405   TRIG 67 03/09/2020 0405   HDL 37 (L) 03/09/2020 0405   CHOLHDL 4.5 03/09/2020 0405   VLDL 13 03/09/2020 0405   LDLCALC 115 (H) 03/09/2020 0405    Physical Exam:   VS:  BP (!) 132/90 (BP Location: Left Arm, Patient Position: Sitting, Cuff Size: Large)   Pulse 93   Ht 5\' 4"  (1.626 m)   Wt (!) 282 lb 3.2 oz (128 kg)   SpO2 99%   BMI 48.44 kg/m    Wt Readings from Last 3 Encounters:  06/20/20 (!) 282 lb 3.2 oz (128 kg)  03/17/20 274 lb 6.4 oz (124.5 kg)  03/10/20 276 lb 3.2 oz (125.3 kg)    General: Well nourished, well developed, in no acute distress Heart: Atraumatic, normal size  Eyes: PEERLA, EOMI  Neck: Supple, no JVD Endocrine: No thryomegaly Cardiac: Normal S1, S2; RRR; no murmurs, rubs, or gallops Lungs: Clear to auscultation bilaterally, no wheezing, rhonchi or rales  Abd: Soft, nontender, no hepatomegaly  Ext: No edema, pulses 2+ Musculoskeletal: No deformities, BUE and BLE strength normal and equal Skin: Warm and dry, no rashes   Neuro: Alert and oriented to person, place, time, and situation, CNII-XII grossly intact, no focal deficits  Psych: Normal mood and affect   ASSESSMENT:   Reginald Gonzalez is a 49 y.o. male who presents for the following: 1. Apical variant hypertrophic cardiomyopathy (HCC)   2. Coronary artery disease involving native coronary artery of native heart without angina pectoris   3. Essential hypertension   4. Mixed hyperlipidemia   5. Obesity, morbid, BMI 40.0-49.9 (HCC)     PLAN:   1. Apical variant hypertrophic cardiomyopathy (HCC) -Found to have apical variant hypertrophic cardiomyopathy for work-up of non-STEMI.  He has no syncope.  No family history of sudden cardiac death.  He does not have an apical  aneurysm.  He has 9% late yet enhancement. -There is no outflow tract obstruction.  There was no LV thrombus. -We did discuss the need for genetic testing.  He will think about this.  His first-degree siblings do need to be screened.  He will let them know.  We will plan to check periodic echocardiograms with contrast just to screen for any apical aneurysm. -He has no high risk features to mandate ICD.  We will continue to evaluate this on a yearly basis.  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  2. Coronary artery disease involving native coronary artery of native heart without angina pectoris -Admitted for chest pain April 2021.  Had minimally elevated troponin values.  Likely hypertension in the setting of hypertrophic cardiomyopathy.  He had 40% RCA lesion in 50% circumflex lesion.  He was noted to have vasospasm.  We will continue treating with amlodipine.  We will continue with aspirin and Lipitor.  We will plan to recheck lipid profile in the next few weeks when he is fasting.  3. Essential hypertension -Blood pressure well controlled today.  Needs to lose weight.  Counseled extensively on this.  4. Mixed hyperlipidemia -Continue Lipitor 40 mg daily.  Plan to recheck lipid profile next few weeks.  5. Obesity, morbid, BMI 40.0-49.9 (HCC) -Counseled extensively on diet and exercise.  Disposition: Return in about 6 months (around 12/21/2020).  Medication Adjustments/Labs and Tests Ordered: Current medicines are reviewed at length with the patient today.  Concerns regarding medicines are outlined above.  Orders Placed This Encounter  Procedures  . Lipid panel   Meds ordered this encounter  Medications  . amLODipine (NORVASC) 5 MG tablet    Sig: Take 1 tablet (5 mg total) by mouth daily.    Dispense:  90 tablet    Refill:  3  . aspirin EC 81 MG tablet    Sig: Take 1 tablet (81 mg total) by mouth daily. Swallow whole.    Dispense:  90 tablet    Refill:  3  .  atorvastatin (LIPITOR) 20 MG tablet    Sig: Take 1 tablet (20 mg total) by mouth at bedtime.    Dispense:  90 tablet    Refill:  1  . valsartan-hydrochlorothiazide (DIOVAN-HCT) 160-12.5 MG tablet    Sig: Take 1 tablet by mouth in the morning.    Dispense:  90 tablet    Refill:  1  . metFORMIN (GLUCOPHAGE) 500 MG tablet    Sig: Take 1 tablet (500 mg total) by mouth daily with breakfast.    Dispense:  90 tablet    Refill:  1    Patient Instructions  Medication Instructions:  Take Amlodipine 5 mg daily  Take Aspirin 81 mg daily (this is over the counter) Take Lipitor 20 mg daily   *If you need a refill on your cardiac medications before your next appointment, please call your pharmacy*   Lab Work: LIPID in a few weeks; come fasting- nothing to eat or drink, no lab appointment needed  If you have labs (blood work) drawn today and your tests are completely normal, you will receive your results only by: Marland Kitchen MyChart Message (if you have MyChart) OR . A paper copy in the mail If you have any lab test that is abnormal or we need to change your treatment, we will call you to review the results.   Follow-Up: At [facility], you and your health needs are our priority.  As part of our continuing mission to provide you with exceptional heart care, we have created designated Provider Care Teams.  These Care Teams include your primary Cardiologist (physician) and Advanced Practice Providers (APPs -  Physician Assistants and Nurse Practitioners) who all work together to provide you with the care you need, when you need it.  We recommend signing up for the patient portal called "MyChart".  Sign up information is provided on this After Visit Summary.  MyChart is used to connect with patients for Virtual Visits (Telemedicine).  Patients are able to view lab/test results, encounter notes, upcoming appointments, etc.  Non-urgent messages can be sent to your provider as well.   To learn more about what  you can do with MyChart, go to [email or web address].    Your next appointment:   6 month(s)  The format for your next appointment:   In Person  Provider:   Lennie Odor, MD        Time Spent with Patient: I have spent a total of 35 minutes with patient reviewing hospital notes, telemetry, EKGs, labs and examining the patient as well as establishing an assessment and plan that was discussed with the patient.  > 50% of time was spent in direct patient care.  Signed, Lenna Gilford. Flora Lipps, MD [facility]  [address], [address] [place], [place] [address] [phone]  06/20/2020 10:08 AM

## 2020-06-20 ENCOUNTER — Other Ambulatory Visit: Payer: Self-pay

## 2020-06-20 ENCOUNTER — Encounter: Payer: Self-pay | Admitting: Cardiovascular Disease

## 2020-06-20 ENCOUNTER — Ambulatory Visit: Payer: BC Managed Care – PPO | Admitting: Cardiovascular Disease

## 2020-06-20 VITALS — BP 132/90 | HR 93 | Ht 64.0 in | Wt 282.2 lb

## 2020-06-20 DIAGNOSIS — I2510 Atherosclerotic heart disease of native coronary artery without angina pectoris: Secondary | ICD-10-CM | POA: Diagnosis not present

## 2020-06-20 DIAGNOSIS — E782 Mixed hyperlipidemia: Secondary | ICD-10-CM

## 2020-06-20 DIAGNOSIS — I422 Other hypertrophic cardiomyopathy: Secondary | ICD-10-CM | POA: Diagnosis not present

## 2020-06-20 DIAGNOSIS — I10 Essential (primary) hypertension: Secondary | ICD-10-CM | POA: Diagnosis not present

## 2020-06-20 MED ORDER — ATORVASTATIN CALCIUM 20 MG PO TABS: 20.0000 mg | ORAL_TABLET | Freq: Every day | ORAL | 1 refills | Status: DC

## 2020-06-20 MED ORDER — VALSARTAN-HYDROCHLOROTHIAZIDE 160-12.5 MG PO TABS: 1.0000 | ORAL_TABLET | Freq: Every morning | ORAL | 1 refills | Status: DC

## 2020-06-20 MED ORDER — ASPIRIN EC 81 MG PO TBEC: 81.0000 mg | DELAYED_RELEASE_TABLET | Freq: Every day | ORAL | 3 refills | Status: AC

## 2020-06-20 MED ORDER — METFORMIN HCL 500 MG PO TABS: 500.0000 mg | ORAL_TABLET | Freq: Every day | ORAL | 1 refills | Status: DC

## 2020-06-20 MED ORDER — AMLODIPINE BESYLATE 5 MG PO TABS: 5.0000 mg | ORAL_TABLET | Freq: Every day | ORAL | 3 refills | Status: DC

## 2020-06-20 NOTE — Patient Instructions (Addendum)
Medication Instructions:  Take Amlodipine 5 mg daily  Take Aspirin 81 mg daily (this is over the counter) Take Lipitor 20 mg daily   *If you need a refill on your cardiac medications before your next appointment, please call your pharmacy*   Lab Work: LIPID in a few weeks; come fasting- nothing to eat or drink, no lab appointment needed  If you have labs (blood work) drawn today and your tests are completely normal, you will receive your results only by:  MyChart Message (if you have MyChart) OR  A paper copy in the mail If you have any lab test that is abnormal or we need to change your treatment, we will call you to review the results.   Follow-Up: At [facility], you and your health needs are our priority.  As part of our continuing mission to provide you with exceptional heart care, we have created designated Provider Care Teams.  These Care Teams include your primary Cardiologist (physician) and Advanced Practice Providers (APPs -  Physician Assistants and Nurse Practitioners) who all work together to provide you with the care you need, when you need it.  We recommend signing up for the patient portal called "MyChart".  Sign up information is provided on this After Visit Summary.  MyChart is used to connect with patients for Virtual Visits (Telemedicine).  Patients are able to view lab/test results, encounter notes, upcoming appointments, etc.  Non-urgent messages can be sent to your provider as well.   To learn more about what you can do with MyChart, go to [email or web address].    Your next appointment:   6 month(s)  The format for your next appointment:   In Person  Provider:   Lennie Odor, MD

## 2020-07-25 DIAGNOSIS — E782 Mixed hyperlipidemia: Secondary | ICD-10-CM | POA: Diagnosis not present

## 2020-07-26 LAB — LIPID PANEL
Chol/HDL Ratio: 5.2 ratio — ABNORMAL HIGH (ref 0.0–5.0)
Cholesterol, Total: 201 mg/dL — ABNORMAL HIGH (ref 100–199)
HDL: 39 mg/dL — ABNORMAL LOW (ref >39)
LDL Chol Calc (NIH): 135 mg/dL — ABNORMAL HIGH (ref 0–99)
Triglycerides: 151 mg/dL — ABNORMAL HIGH (ref 0–149)
VLDL Cholesterol Cal: 27 mg/dL (ref 5–40)

## 2020-07-27 ENCOUNTER — Other Ambulatory Visit: Payer: Self-pay

## 2020-07-27 MED ORDER — ATORVASTATIN CALCIUM 80 MG PO TABS: 80.0000 mg | ORAL_TABLET | Freq: Every day | ORAL | 1 refills | Status: DC

## 2020-09-19 DIAGNOSIS — Z23 Encounter for immunization: Secondary | ICD-10-CM | POA: Diagnosis not present

## 2020-09-19 DIAGNOSIS — E785 Hyperlipidemia, unspecified: Secondary | ICD-10-CM | POA: Diagnosis not present

## 2020-09-19 DIAGNOSIS — E782 Mixed hyperlipidemia: Secondary | ICD-10-CM | POA: Diagnosis not present

## 2020-09-19 DIAGNOSIS — I10 Essential (primary) hypertension: Secondary | ICD-10-CM | POA: Diagnosis not present

## 2020-09-19 DIAGNOSIS — R7303 Prediabetes: Secondary | ICD-10-CM | POA: Diagnosis not present

## 2020-12-26 ENCOUNTER — Ambulatory Visit: Payer: BC Managed Care – PPO | Admitting: Cardiovascular Disease

## 2021-01-09 DIAGNOSIS — M25512 Pain in left shoulder: Secondary | ICD-10-CM | POA: Diagnosis not present

## 2021-01-09 DIAGNOSIS — I10 Essential (primary) hypertension: Secondary | ICD-10-CM | POA: Diagnosis not present

## 2021-01-09 DIAGNOSIS — M79622 Pain in left upper arm: Secondary | ICD-10-CM | POA: Diagnosis not present

## 2021-01-09 DIAGNOSIS — R7303 Prediabetes: Secondary | ICD-10-CM | POA: Diagnosis not present

## 2021-01-10 DIAGNOSIS — M25512 Pain in left shoulder: Secondary | ICD-10-CM | POA: Diagnosis not present

## 2021-01-10 DIAGNOSIS — I10 Essential (primary) hypertension: Secondary | ICD-10-CM | POA: Diagnosis not present

## 2021-01-10 DIAGNOSIS — R7303 Prediabetes: Secondary | ICD-10-CM | POA: Diagnosis not present

## 2021-01-11 ENCOUNTER — Other Ambulatory Visit: Payer: Self-pay | Admitting: Cardiovascular Disease

## 2021-02-08 ENCOUNTER — Ambulatory Visit: Payer: BC Managed Care – PPO | Admitting: Cardiovascular Disease

## 2021-02-08 NOTE — Progress Notes (Deleted)
Cardiology Office Note:   Date:  02/08/2021  NAME:  Reginald Gonzalez    MRN: [id number] DOB:  January 15, 1971   PCP:  Mirna Mires, MD  Cardiologist:  Reatha Harps, MD  Electrophysiologist:  None   Referring MD: Mirna Mires, MD   No chief complaint on file. ***  History of Present Illness:   Reginald Gonzalez is a 50 y.o. male with a hx of DM, HTN, apical variant HCM, obesity who presents for follow-up. Needs repeat lipid profile. Negative risk factors for SCD.   Problem List 1. Apical Variant Hypertrophic CM -3 day zio negative for VT -no syncope -no FH SCD -9% LGE -no apical aneurysm 2. Non-obstructive CAD/NSTEMI -02/2020: 40% RCA, 50% mid LCX -vasospasm noticed during cath 3. DM -A1c 6.4 4. HTN 5. HLD -T chol 165, HDL 37, LDL 115, TG 67 6. Obesity (BMI 48)   Past Medical History: Past Medical History:  Diagnosis Date  . Diabetes mellitus   . Hypertension     Past Surgical History: Past Surgical History:  Procedure Laterality Date  . INTRAVASCULAR ULTRASOUND/IVUS N/A 03/09/2020   Procedure: Intravascular Ultrasound/IVUS;  Surgeon: Corky Crafts, MD;  Location: [facility] INVASIVE CV LAB;  Service: Cardiovascular;  Laterality: N/A;  . LEFT HEART CATH AND CORONARY ANGIOGRAPHY N/A 03/09/2020   Procedure: LEFT HEART CATH AND CORONARY ANGIOGRAPHY;  Surgeon: Corky Crafts, MD;  Location: [facility] INVASIVE CV LAB;  Service: Cardiovascular;  Laterality: N/A;    Current Medications: No outpatient medications have been marked as taking for the 02/08/21 encounter (Appointment) with O'Neal, Ronnald Ramp, MD.     Allergies:    Patient has no known allergies.   Social History: Social History   Socioeconomic History  . Marital status: Married    Spouse name: Not on file  . Number of children: Not on file  . Years of education: Not on file  . Highest education level: Not on file  Occupational History  . Not on file  Tobacco Use  . Smoking status: Former Games developer  .  Smokeless tobacco: Never Used  Vaping Use  . Vaping Use: Never used  Substance and Sexual Activity  . Alcohol use: Yes    Comment: occasional  . Drug use: No  . Sexual activity: Not on file  Other Topics Concern  . Not on file  Social History Narrative  . Not on file   Social Determinants of Health   Financial Resource Strain: Not on file  Food Insecurity: Not on file  Transportation Needs: Not on file  Physical Activity: Not on file  Stress: Not on file  Social Connections: Not on file     Family History: The patient's ***family history includes Healthy in his father; Hypertension in his mother.  ROS:   All other ROS reviewed and negative. Pertinent positives noted in the HPI.     EKGs/Labs/Other Studies Reviewed:   The following studies were personally reviewed by me today:  EKG:  EKG is *** ordered today.  The ekg ordered today demonstrates ***, and was personally reviewed by me.   CMR 03/10/2020 IMPRESSION: 1. Asymmetric LV hypertrophy, most pronounced in apex with apical lateral wall measuring up to 57mm (basal posterior wall 68mm). This is consistent with apical variant hypertrophic cardiomyopathy  2. Patchy late gadolinium enhancement predominantly at LV apex, consistent with HCM. LGE accounts for 9% of total myocardial mass  3.   No evidence of myocarditis with normal native T1,T2, and ECV  4.  Normal LV size with hyperdynamic systolic function (EF 73%)  5.   Normal RV size and systolic function (EF 67%)  Zio 03/15/2020 IMPRESSION: 1. Asymmetric LV hypertrophy, most pronounced in apex with apical lateral wall measuring up to 47mm (basal posterior wall 30mm). This is consistent with apical variant hypertrophic cardiomyopathy  2. Patchy late gadolinium enhancement predominantly at LV apex, consistent with HCM. LGE accounts for 9% of total myocardial mass  3.   No evidence of myocarditis with normal native T1,T2, and ECV  4.   Normal LV size with  hyperdynamic systolic function (EF 73%)  5.   Normal RV size and systolic function (EF 67%)  Recent Labs: 03/09/2020: B Natriuretic Peptide 22.5; Magnesium 1.9; TSH 1.019 03/10/2020: BUN 13; Creatinine, Ser 1.29; Hemoglobin 13.9; Platelets 274; Potassium 4.2; Sodium 137   Recent Lipid Panel    Component Value Date/Time   CHOL 201 (H) 07/25/2020 1508   TRIG 151 (H) 07/25/2020 1508   HDL 39 (L) 07/25/2020 1508   CHOLHDL 5.2 (H) 07/25/2020 1508   CHOLHDL 4.5 03/09/2020 0405   VLDL 13 03/09/2020 0405   LDLCALC 135 (H) 07/25/2020 1508    Physical Exam:   VS:  There were no vitals taken for this visit.   Wt Readings from Last 3 Encounters:  06/20/20 (!) 282 lb 3.2 oz (128 kg)  03/17/20 274 lb 6.4 oz (124.5 kg)  03/10/20 276 lb 3.2 oz (125.3 kg)    General: Well nourished, well developed, in no acute distress Head: Atraumatic, normal size  Eyes: PEERLA, EOMI  Neck: Supple, no JVD Endocrine: No thryomegaly Cardiac: Normal S1, S2; RRR; no murmurs, rubs, or gallops Lungs: Clear to auscultation bilaterally, no wheezing, rhonchi or rales  Abd: Soft, nontender, no hepatomegaly  Ext: No edema, pulses 2+ Musculoskeletal: No deformities, BUE and BLE strength normal and equal Skin: Warm and dry, no rashes   Neuro: Alert and oriented to person, place, time, and situation, CNII-XII grossly intact, no focal deficits  Psych: Normal mood and affect   ASSESSMENT:   Reginald Gonzalez is a 50 y.o. male who presents for the following: No diagnosis found.  PLAN:   There are no diagnoses linked to this encounter.  Disposition: No follow-ups on file.  Medication Adjustments/Labs and Tests Ordered: Current medicines are reviewed at length with the patient today.  Concerns regarding medicines are outlined above.  No orders of the defined types were placed in this encounter.  No orders of the defined types were placed in this encounter.   There are no Patient Instructions on file for this visit.    Time Spent with Patient: I have spent a total of *** minutes with patient reviewing hospital notes, telemetry, EKGs, labs and examining the patient as well as establishing an assessment and plan that was discussed with the patient.  > 50% of time was spent in direct patient care.  Signed, Lenna Gilford. Flora Lipps, MD, [facility]  [facility]  [address] [place], [place] [address] [phone]  02/08/2021 6:44 AM

## 2021-02-18 DIAGNOSIS — I10 Essential (primary) hypertension: Secondary | ICD-10-CM | POA: Diagnosis not present

## 2021-02-18 DIAGNOSIS — R7303 Prediabetes: Secondary | ICD-10-CM | POA: Diagnosis not present

## 2021-02-18 DIAGNOSIS — M79602 Pain in left arm: Secondary | ICD-10-CM | POA: Diagnosis not present

## 2021-02-18 DIAGNOSIS — Z7189 Other specified counseling: Secondary | ICD-10-CM | POA: Diagnosis not present

## 2021-02-18 DIAGNOSIS — E782 Mixed hyperlipidemia: Secondary | ICD-10-CM | POA: Diagnosis not present

## 2021-02-18 DIAGNOSIS — M10071 Idiopathic gout, right ankle and foot: Secondary | ICD-10-CM | POA: Diagnosis not present

## 2021-04-22 ENCOUNTER — Encounter (HOSPITAL_COMMUNITY): Payer: Self-pay

## 2021-04-22 ENCOUNTER — Ambulatory Visit (HOSPITAL_COMMUNITY)
Admission: EM | Admit: 2021-04-22 | Discharge: 2021-04-22 | Disposition: A | Discharge status: Home / Self Care | Payer: BC Managed Care – PPO | Attending: Internal Medicine | Admitting: Internal Medicine

## 2021-04-22 ENCOUNTER — Other Ambulatory Visit: Payer: Self-pay

## 2021-04-22 DIAGNOSIS — N390 Urinary tract infection, site not specified: Secondary | ICD-10-CM

## 2021-04-22 LAB — POCT URINALYSIS DIPSTICK, ED / UC
Bilirubin Urine: NEGATIVE
Glucose, UA: NEGATIVE mg/dL
Ketones, ur: NEGATIVE mg/dL
Nitrite: NEGATIVE
Protein, ur: NEGATIVE mg/dL
Specific Gravity, Urine: 1.02 (ref 1.005–1.030)
Urobilinogen, UA: 0.2 mg/dL (ref 0.0–1.0)
pH: 6 (ref 5.0–8.0)

## 2021-04-22 MED ORDER — NITROFURANTOIN MONOHYD MACRO 100 MG PO CAPS: 100.0000 mg | ORAL_CAPSULE | Freq: Two times a day (BID) | ORAL | 0 refills | Status: DC

## 2021-04-22 NOTE — ED Triage Notes (Signed)
Pt present urinary frequency with pressure while urinating. Symptom started a week ago.

## 2021-04-22 NOTE — ED Provider Notes (Signed)
[facility]   CC: UTI  SUBJECTIVE:  Reginald Gonzalez is a 50 y.o. male who complains of urinary frequency, urgency and dysuria for the past week.  Patient denies a precipitating event, recent sexual encounter, excessive caffeine intake. Denies any abdominal, back or flank pain.  Has tried OTC medications without relief.  Symptoms are made worse with urination. Admits to similar symptoms in the past.  Denies fever, chills, nausea, vomiting, abdominal pain, flank pain, abnormal vaginal discharge or bleeding, hematuria.    ROS: As in HPI.  All other pertinent ROS negative.     Past Medical History:  Diagnosis Date  . Diabetes mellitus   . Hypertension    Past Surgical History:  Procedure Laterality Date  . INTRAVASCULAR ULTRASOUND/IVUS N/A 03/09/2020   Procedure: Intravascular Ultrasound/IVUS;  Surgeon: Corky Crafts, MD;  Location: [facility] INVASIVE CV LAB;  Service: Cardiovascular;  Laterality: N/A;  . LEFT HEART CATH AND CORONARY ANGIOGRAPHY N/A 03/09/2020   Procedure: LEFT HEART CATH AND CORONARY ANGIOGRAPHY;  Surgeon: Corky Crafts, MD;  Location: [facility] INVASIVE CV LAB;  Service: Cardiovascular;  Laterality: N/A;   No Known Allergies No current facility-administered medications on file prior to encounter.   Current Outpatient Medications on File Prior to Encounter  Medication Sig Dispense Refill  . acetaminophen (TYLENOL) 325 MG tablet Take 650 mg by mouth every 6 (six) hours as needed for mild pain or headache.    Marland Kitchen amLODipine (NORVASC) 5 MG tablet Take 1 tablet (5 mg total) by mouth daily. 90 tablet 3  . aspirin EC 81 MG tablet Take 1 tablet (81 mg total) by mouth daily. Swallow whole. 90 tablet 3  . atorvastatin (LIPITOR) 80 MG tablet Take 1 tablet (80 mg total) by mouth at bedtime. 90 tablet 1  . clotrimazole-betamethasone (LOTRISONE) cream Apply 1 application topically at bedtime as needed (for imflammation/redness- toes).     . metFORMIN (GLUCOPHAGE) 500 MG  tablet Take 1 tablet (500 mg total) by mouth daily with breakfast. 90 tablet 1  . tadalafil (CIALIS) 20 MG tablet Take 20 mg by mouth daily as needed for erectile dysfunction.    . valsartan-hydrochlorothiazide (DIOVAN-HCT) 160-12.5 MG tablet TAKE 1 TABLET BY MOUTH EVERY DAY IN THE MORNING 90 tablet 2   Social History   Socioeconomic History  . Marital status: Married    Spouse name: Not on file  . Number of children: Not on file  . Years of education: Not on file  . Highest education level: Not on file  Occupational History  . Not on file  Tobacco Use  . Smoking status: Former Games developer  . Smokeless tobacco: Never Used  Vaping Use  . Vaping Use: Never used  Substance and Sexual Activity  . Alcohol use: Yes    Comment: occasional  . Drug use: No  . Sexual activity: Not on file  Other Topics Concern  . Not on file  Social History Narrative  . Not on file   Social Determinants of Health   Financial Resource Strain: Not on file  Food Insecurity: Not on file  Transportation Needs: Not on file  Physical Activity: Not on file  Stress: Not on file  Social Connections: Not on file  Intimate Partner Violence: Not on file   Family History  Problem Relation Age of Onset  . Hypertension Mother   . Healthy Father     OBJECTIVE:  Vitals:   04/22/21 1538  BP: (!) 133/91  Pulse: 79  Resp: 16  Temp: 98.5 F (36.9 C)  TempSrc: Oral  SpO2: 100%   General appearance: AOx3 in no acute distress HEENT: NCAT. Oropharynx clear.  Lungs: clear to auscultation bilaterally without adventitious breath sounds Heart: regular rate and rhythm. Radial pulses 2+ symmetrical bilaterally Abdomen: soft; non-distended; no tenderness; bowel sounds present; no guarding or rebound tenderness Back: no CVA tenderness Extremities: no edema; symmetrical with no gross deformities Skin: warm and dry Neurologic: Ambulates from chair to exam table without difficulty Psychological: alert and cooperative;  normal mood and affect  Labs Reviewed  POCT URINALYSIS DIPSTICK, ED / UC - Abnormal; Notable for the following components:      Result Value   Hgb urine dipstick SMALL (*)    Leukocytes,Ua TRACE (*)    All other components within normal limits  URINE CULTURE    ASSESSMENT & PLAN:  1. Lower urinary tract infectious disease     Meds ordered this encounter  Medications  . nitrofurantoin, macrocrystal-monohydrate, (MACROBID) 100 MG capsule    Sig: Take 1 capsule (100 mg total) by mouth 2 (two) times daily.    Dispense:  10 capsule    Refill:  0    Order Specific Question:   Supervising Provider    Answer:   Merrilee Jansky [id number]    Urine culture sent  We will call you with abnormal results that need further treatment Push fluids and get plenty of rest Take antibiotic as directed and to completion Take pyridium as prescribed and as needed for symptomatic relief Follow up with PCP if symptoms persists Return here or go to ER if you have any new or worsening symptoms such as fever, worsening abdominal pain, nausea/vomiting, flank pain  Outlined signs and symptoms indicating need for more acute intervention Patient verbalized understanding After Visit Summary given     Ivette Loyal, NP 04/22/21 1640

## 2021-04-22 NOTE — Discharge Instructions (Addendum)
Take the macrobid twice a day for the next 5 days.    You can continue to take the AZO or AZO cranberry for symptom management.    Make sure you are drinking plenty of fluids and rest.     Return or go to the Emergency Department if symptoms worsen or do not improve in the next few days.

## 2021-04-23 LAB — URINE CULTURE: Culture: NO GROWTH

## 2021-05-21 ENCOUNTER — Encounter (HOSPITAL_COMMUNITY): Payer: Self-pay

## 2021-05-21 ENCOUNTER — Ambulatory Visit (HOSPITAL_COMMUNITY)
Admission: EM | Admit: 2021-05-21 | Discharge: 2021-05-21 | Disposition: A | Discharge status: Home / Self Care | Payer: Self-pay | Attending: Internal Medicine | Admitting: Internal Medicine

## 2021-05-21 ENCOUNTER — Other Ambulatory Visit: Payer: Self-pay

## 2021-05-21 DIAGNOSIS — R300 Dysuria: Secondary | ICD-10-CM

## 2021-05-21 LAB — POCT URINALYSIS DIPSTICK, ED / UC
Bilirubin Urine: NEGATIVE
Glucose, UA: NEGATIVE mg/dL
Hgb urine dipstick: NEGATIVE
Ketones, ur: NEGATIVE mg/dL
Leukocytes,Ua: NEGATIVE
Nitrite: NEGATIVE
Protein, ur: NEGATIVE mg/dL
Specific Gravity, Urine: 1.02 (ref 1.005–1.030)
Urobilinogen, UA: 0.2 mg/dL (ref 0.0–1.0)
pH: 6 (ref 5.0–8.0)

## 2021-05-21 NOTE — Discharge Instructions (Addendum)
We will contact you with the results from your lab work and any additional treatment.    Make sure you are drinking plenty of water.  You can drink cranberry juice or take AZO for symptom management.   Do not have sex while taking undergoing treatment for STI.  Make sure that all of your partners get tested and treated.   Use a condom or other barrier method for all sexual encounters.    Return or go to the Emergency Department if symptoms worsen or do not improve in the next few days.

## 2021-05-21 NOTE — ED Provider Notes (Signed)
[facility]    CSN: [id number] Arrival date & time: 05/21/21  1407      History   Chief Complaint Chief Complaint  Patient presents with   Dysuria    HPI Reginald Gonzalez is a 50 y.o. male.   Patient here for evaluation of dysuria that has been ongoing for the past few weeks.  Reports being evaluated approximately 2 weeks ago and diagnosed with a UTI for which he was given antibiotics.  Reports taking antibiotics but still having the dysuria.  Denies any urgency or frequency.  Denies any penile pain or discharge.  Has not tried any OTC medications or treatments.  Denies any trauma, injury, or other precipitating event.  Denies any specific alleviating or aggravating factors.  Denies any fevers, chest pain, shortness of breath, N/V/D, numbness, tingling, weakness, abdominal pain, or headaches.     The history is provided by the patient.  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   NSTEMI (non-ST elevated myocardial infarction) (HCC) 03/09/2020   HLD (hyperlipidemia) 03/09/2020   Morbid obesity with BMI of 50.0-59.9, adult (HCC) 03/09/2020   Diabetes mellitus (HCC) 10/21/2011   HTN (hypertension) 10/21/2011    Past Surgical History:  Procedure Laterality Date   INTRAVASCULAR ULTRASOUND/IVUS N/A 03/09/2020   Procedure: Intravascular Ultrasound/IVUS;  Surgeon: Corky Crafts, MD;  Location: [facility] INVASIVE CV LAB;  Service: Cardiovascular;  Laterality: N/A;   LEFT HEART CATH AND CORONARY ANGIOGRAPHY N/A 03/09/2020   Procedure: LEFT HEART CATH AND CORONARY ANGIOGRAPHY;  Surgeon: Corky Crafts, MD;  Location: [facility]  INVASIVE CV LAB;  Service: Cardiovascular;  Laterality: N/A;       Home Medications    Prior to Admission medications   Medication Sig Start Date End Date Taking? Authorizing Provider  acetaminophen (TYLENOL) 325 MG tablet Take 650 mg by mouth every 6 (six) hours as needed for mild pain or headache.   Yes [provider]  amLODipine (NORVASC) 5 MG tablet Take 1 tablet (5 mg total) by mouth daily. 06/20/20  Yes O'Neal, Ronnald Ramp, MD  aspirin EC 81 MG tablet Take 1 tablet (81 mg total) by mouth daily. Swallow whole. 06/20/20  Yes O'Neal, Ronnald Ramp, MD  atorvastatin (LIPITOR) 80 MG tablet Take 1 tablet (80 mg total) by mouth at bedtime. 07/27/20  Yes O'Neal, Ronnald Ramp, MD  clotrimazole-betamethasone (LOTRISONE) cream Apply 1 application topically at bedtime as needed (for imflammation/redness- toes).  02/08/20  Yes [provider]  metFORMIN (GLUCOPHAGE) 500 MG tablet Take 1 tablet (500 mg total) by mouth daily with breakfast. 06/20/20  Yes O'Neal, Ronnald Ramp, MD  nitrofurantoin, macrocrystal-monohydrate, (MACROBID) 100 MG capsule Take 1 capsule (100 mg total) by mouth 2 (two) times daily. 04/22/21  Yes Ivette Loyal, NP  valsartan-hydrochlorothiazide (DIOVAN-HCT) 160-12.5 MG tablet TAKE 1 TABLET BY MOUTH EVERY DAY IN THE MORNING 01/12/21  Yes O'Neal, Ronnald Ramp, MD  tadalafil (CIALIS) 20 MG tablet Take 20 mg by mouth daily as needed for erectile dysfunction. 03/05/20   [provider]    Family History Family History  Problem Relation Age of Onset   Hypertension Mother  Healthy Father     Social History Social History   Tobacco Use   Smoking status: Former    Pack years: 0.00   Smokeless tobacco: Never  Vaping Use   Vaping Use: Never used  Substance Use Topics   Alcohol use: Yes    Comment: occasional   Drug use: No     Allergies   Patient has no known allergies.   Review of Systems Review of Systems  Genitourinary:  Positive for  dysuria. Negative for difficulty urinating, flank pain, genital sores, hematuria, penile discharge, penile pain, penile swelling, scrotal swelling, testicular pain and urgency.  All other systems reviewed and are negative.   Physical Exam Triage Vital Signs ED Triage Vitals  Enc Vitals Group     BP 05/21/21 1438 (!) 131/94     Pulse Rate 05/21/21 1438 80     Resp 05/21/21 1438 18     Temp 05/21/21 1438 98.2 F (36.8 C)     Temp Source 05/21/21 1438 Oral     SpO2 05/21/21 1438 100 %     Weight --      Height --      Head Circumference --      Peak Flow --      Pain Score 05/21/21 1435 0     Pain Loc --      Pain Edu? --      Excl. in GC? --    No data found.  Updated Vital Signs BP (!) 131/94   Pulse 80   Temp 98.2 F (36.8 C) (Oral)   Resp 18   SpO2 100%   Visual Acuity Right Eye Distance:   Left Eye Distance:   Bilateral Distance:    Right Eye Near:   Left Eye Near:    Bilateral Near:     Physical Exam Vitals and nursing note reviewed.  Constitutional:      General: He is not in acute distress.    Appearance: Normal appearance. He is not ill-appearing, toxic-appearing or diaphoretic.  HENT:     Head: Normocephalic and atraumatic.  Eyes:     Conjunctiva/sclera: Conjunctivae normal.  Cardiovascular:     Rate and Rhythm: Normal rate.     Pulses: Normal pulses.  Pulmonary:     Effort: Pulmonary effort is normal.  Abdominal:     General: Abdomen is flat.     Tenderness: There is no right CVA tenderness or left CVA tenderness.  Genitourinary:    Comments: declines Musculoskeletal:        General: Normal range of motion.     Cervical back: Normal range of motion.  Skin:    General: Skin is warm and dry.  Neurological:     General: No focal deficit present.     Mental Status: He is alert and oriented to person, place, and time.  Psychiatric:        Mood and Affect: Mood normal.     UC Treatments / Results  Labs (all labs ordered are listed, but  only abnormal results are displayed) Labs Reviewed  POCT URINALYSIS DIPSTICK, ED / UC  CYTOLOGY, (ORAL, ANAL, URETHRAL) ANCILLARY ONLY    EKG   Radiology No results found.  Procedures Procedures (including critical care time)  Medications Ordered in UC Medications - No data to display  Initial Impression / Assessment and Plan / UC Course  I have reviewed the triage vital signs and the nursing notes.  Pertinent labs & imaging results that were  available during my care of the patient were reviewed by me and considered in my medical decision making (see chart for details).    Assessment negative for red flags or concerns.  Urinalysis negative for any signs of infection.  As patient is only experiencing dysuria recommend testing for STIs.  Self swab obtained and will treat based on results.  Encourage fluids and rest.  May drink cranberry juice or take AZO for symptom management.  Recommend following up with primary care in the next week for further evaluation if symptoms do not improve. Final Clinical Impressions(s) / UC Diagnoses   Final diagnoses:  Dysuria     Discharge Instructions      We will contact you with the results from your lab work and any additional treatment.    Make sure you are drinking plenty of water.  You can drink cranberry juice or take AZO for symptom management.   Do not have sex while taking undergoing treatment for STI.  Make sure that all of your partners get tested and treated.   Use a condom or other barrier method for all sexual encounters.    Return or go to the Emergency Department if symptoms worsen or do not improve in the next few days.       ED Prescriptions   None    PDMP not reviewed this encounter.   Ivette Loyal, NP 05/21/21 1517

## 2021-05-21 NOTE — ED Triage Notes (Addendum)
Pt reports burning with urination for about 2 weeks. Reports was seen for this recently and given medication but feels symptoms have not gone away. States is not yet done with meds for this.

## 2021-05-22 LAB — CYTOLOGY, (ORAL, ANAL, URETHRAL) ANCILLARY ONLY
Chlamydia: NEGATIVE
Comment: NEGATIVE
Comment: NEGATIVE
Comment: NORMAL
Neisseria Gonorrhea: NEGATIVE
Trichomonas: POSITIVE — AB

## 2021-05-24 ENCOUNTER — Telehealth (HOSPITAL_COMMUNITY): Payer: Self-pay | Admitting: Emergency Medicine

## 2021-05-24 MED ORDER — METRONIDAZOLE 500 MG PO TABS: 500.0000 mg | ORAL_TABLET | Freq: Two times a day (BID) | ORAL | 0 refills | Status: DC

## 2021-05-24 NOTE — Telephone Encounter (Signed)
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

## 2021-06-23 ENCOUNTER — Other Ambulatory Visit: Payer: Self-pay

## 2021-06-23 ENCOUNTER — Encounter (HOSPITAL_COMMUNITY): Payer: Self-pay

## 2021-06-23 ENCOUNTER — Ambulatory Visit (HOSPITAL_COMMUNITY)
Admission: EM | Admit: 2021-06-23 | Discharge: 2021-06-23 | Disposition: A | Discharge status: Home / Self Care | Payer: Self-pay | Attending: Family Medicine | Admitting: Family Medicine

## 2021-06-23 DIAGNOSIS — Z202 Contact with and (suspected) exposure to infections with a predominantly sexual mode of transmission: Secondary | ICD-10-CM | POA: Insufficient documentation

## 2021-06-23 DIAGNOSIS — R300 Dysuria: Secondary | ICD-10-CM | POA: Insufficient documentation

## 2021-06-23 MED ORDER — PHENAZOPYRIDINE HCL 200 MG PO TABS: 200.0000 mg | ORAL_TABLET | Freq: Three times a day (TID) | ORAL | 0 refills | Status: DC

## 2021-06-23 MED ORDER — DOXYCYCLINE HYCLATE 100 MG PO CAPS: 100.0000 mg | ORAL_CAPSULE | Freq: Two times a day (BID) | ORAL | 0 refills | Status: DC

## 2021-06-23 NOTE — ED Triage Notes (Signed)
Pt c/o pressure when urinating, and had exposure to STD. Denies other symptoms.  Started: 2 weeks ago

## 2021-06-23 NOTE — ED Provider Notes (Signed)
[facility]    CSN: [id number] Arrival date & time: 06/23/21  1649      History   Chief Complaint Chief Complaint  Patient presents with   Exposure to STD    HPI Reginald Gonzalez is a 50 y.o. male.   Patient presenting today with 2 to 3-day history of dysuria.  Denies hematuria, penile discharge, rashes, pelvic or abdominal pain, fevers, nausea vomiting or diarrhea.  Has not been trying anything over-the-counter for symptoms.  States his girlfriend recently found out she had chlamydia.   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/2020   NSTEMI (non-ST elevated myocardial infarction) (HCC) 03/09/2020   HLD (hyperlipidemia) 03/09/2020   Morbid obesity with BMI of 50.0-59.9, adult (HCC) 03/09/2020   Diabetes mellitus (HCC) 10/21/2011   HTN (hypertension) 10/21/2011    Past Surgical History:  Procedure Laterality Date   INTRAVASCULAR ULTRASOUND/IVUS N/A 03/09/2020   Procedure: Intravascular Ultrasound/IVUS;  Surgeon: Corky Crafts, MD;  Location: [facility] INVASIVE CV LAB;  Service: Cardiovascular;  Laterality: N/A;   LEFT HEART CATH AND CORONARY ANGIOGRAPHY N/A 03/09/2020   Procedure: LEFT HEART CATH AND CORONARY ANGIOGRAPHY;  Surgeon: Corky Crafts, MD;  Location: [facility] INVASIVE CV LAB;  Service: Cardiovascular;  Laterality: N/A;       Home Medications    Prior to Admission medications   Medication Sig Start Date End Date Taking? Authorizing Provider  doxycycline (VIBRAMYCIN) 100 MG capsule Take 1 capsule (100 mg total) by mouth 2 (two) times daily. 06/23/21  Yes Particia Nearing, PA-C  phenazopyridine (PYRIDIUM) 200 MG tablet Take 1 tablet (200 mg total) by mouth 3 (three) times daily. 06/23/21  Yes Particia Nearing, PA-C  acetaminophen (TYLENOL) 325 MG tablet Take 650 mg by mouth every 6 (six) hours  as needed for mild pain or headache.    [provider]  amLODipine (NORVASC) 5 MG tablet Take 1 tablet (5 mg total) by mouth daily. 06/20/20   O'NealRonnald Ramp, MD  aspirin EC 81 MG tablet Take 1 tablet (81 mg total) by mouth daily. Swallow whole. 06/20/20   Sande Rives, MD  atorvastatin (LIPITOR) 80 MG tablet Take 1 tablet (80 mg total) by mouth at bedtime. 07/27/20   O'NealRonnald Ramp, MD  clotrimazole-betamethasone (LOTRISONE) cream Apply 1 application topically at bedtime as needed (for imflammation/redness- toes).  02/08/20   [provider]  metFORMIN (GLUCOPHAGE) 500 MG tablet Take 1 tablet (500 mg total) by mouth daily with breakfast. 06/20/20   O'Neal, Ronnald Ramp, MD  metroNIDAZOLE (FLAGYL) 500 MG tablet Take 1 tablet (500 mg total) by mouth 2 (two) times daily. 05/24/21   Lamptey, Britta Mccreedy, MD  nitrofurantoin, macrocrystal-monohydrate, (MACROBID) 100 MG capsule Take 1 capsule (100 mg total) by mouth 2 (two) times daily. 04/22/21   Ivette Loyal, NP  tadalafil (CIALIS) 20 MG tablet Take 20 mg by mouth daily as needed for erectile dysfunction. 03/05/20   [provider]  valsartan-hydrochlorothiazide (DIOVAN-HCT) 160-12.5 MG tablet TAKE 1 TABLET BY MOUTH EVERY DAY IN THE MORNING 01/12/21   O'Neal, Ronnald Ramp, MD    Family History Family History  Problem Relation Age of Onset   Hypertension Mother    Healthy Father     Social History Social History   Tobacco Use   Smoking status: Former  Smokeless tobacco: Never  Vaping Use   Vaping Use: Never used  Substance Use Topics   Alcohol use: Yes    Comment: occasional   Drug use: No     Allergies   Patient has no known allergies.   Review of Systems Review of Systems Per HPI  Physical Exam Triage Vital Signs ED Triage Vitals  Enc Vitals Group     BP 06/23/21 1752 (!) 152/92     Pulse Rate 06/23/21 1752 86     Resp 06/23/21 1752 20     Temp 06/23/21 1752 98.7 F (37.1 C)      Temp Source 06/23/21 1752 Oral     SpO2 06/23/21 1752 98 %     Weight --      Height --      Head Circumference --      Peak Flow --      Pain Score 06/23/21 1756 7     Pain Loc --      Pain Edu? --      Excl. in GC? --    No data found.  Updated Vital Signs BP (!) 152/92 (BP Location: Left Arm)   Pulse 86   Temp 98.7 F (37.1 C) (Oral)   Resp 20   SpO2 98%   Visual Acuity Right Eye Distance:   Left Eye Distance:   Bilateral Distance:    Right Eye Near:   Left Eye Near:    Bilateral Near:     Physical Exam Vitals and nursing note reviewed.  Constitutional:      Appearance: Normal appearance.  HENT:     Head: Atraumatic.  Eyes:     Extraocular Movements: Extraocular movements intact.     Conjunctiva/sclera: Conjunctivae normal.  Cardiovascular:     Rate and Rhythm: Normal rate and regular rhythm.  Pulmonary:     Effort: Pulmonary effort is normal.     Breath sounds: Normal breath sounds.  Abdominal:     General: Bowel sounds are normal. There is no distension.     Palpations: Abdomen is soft.     Tenderness: There is no abdominal tenderness. There is no right CVA tenderness, left CVA tenderness or guarding.  Genitourinary:    Comments: GU exam deferred, self swab performed Musculoskeletal:        General: Normal range of motion.     Cervical back: Normal range of motion and neck supple.  Skin:    General: Skin is warm and dry.  Neurological:     General: No focal deficit present.     Mental Status: He is oriented to person, place, and time.  Psychiatric:        Mood and Affect: Mood normal.        Thought Content: Thought content normal.        Judgment: Judgment normal.     UC Treatments / Results  Labs (all labs ordered are listed, but only abnormal results are displayed) Labs Reviewed  CYTOLOGY, (ORAL, ANAL, URETHRAL) ANCILLARY ONLY    EKG   Radiology No results found.  Procedures Procedures (including critical care  time)  Medications Ordered in UC Medications - No data to display  Initial Impression / Assessment and Plan / UC Course  I have reviewed the triage vital signs and the nursing notes.  Pertinent labs & imaging results that were available during my care of the patient were reviewed by me and considered in my medical decision making (see chart for details).  Given known exposure to chlamydia, will start doxycycline while awaiting cytology swab results.  Pyridium sent for the burning sensation.  Continue over-the-counter pain relievers as needed.  Abstinence until results return and treatment completed x7 days post.  Safe sexual practices reviewed  Final Clinical Impressions(s) / UC Diagnoses   Final diagnoses:  Exposure to STD  Dysuria   Discharge Instructions   None    ED Prescriptions     Medication Sig Dispense Auth. Provider   doxycycline (VIBRAMYCIN) 100 MG capsule Take 1 capsule (100 mg total) by mouth 2 (two) times daily. 14 capsule Particia Nearing, [place]   phenazopyridine (PYRIDIUM) 200 MG tablet Take 1 tablet (200 mg total) by mouth 3 (three) times daily. 12 tablet Particia Nearing, [place]      PDMP not reviewed this encounter.   Particia Nearing, [place] 06/23/21 1836

## 2021-06-26 LAB — CYTOLOGY, (ORAL, ANAL, URETHRAL) ANCILLARY ONLY
Chlamydia: NEGATIVE
Comment: NEGATIVE
Comment: NEGATIVE
Comment: NORMAL
Neisseria Gonorrhea: NEGATIVE
Trichomonas: NEGATIVE

## 2021-07-30 ENCOUNTER — Encounter (HOSPITAL_COMMUNITY): Payer: Self-pay | Admitting: *Deleted

## 2021-07-30 ENCOUNTER — Ambulatory Visit (HOSPITAL_COMMUNITY)
Admission: EM | Admit: 2021-07-30 | Discharge: 2021-07-30 | Disposition: A | Discharge status: Home / Self Care | Payer: BC Managed Care – PPO | Attending: Emergency Medicine | Admitting: Emergency Medicine

## 2021-07-30 DIAGNOSIS — U071 COVID-19: Secondary | ICD-10-CM | POA: Insufficient documentation

## 2021-07-30 DIAGNOSIS — J069 Acute upper respiratory infection, unspecified: Secondary | ICD-10-CM | POA: Diagnosis not present

## 2021-07-30 MED ORDER — BENZONATATE 100 MG PO CAPS: 100.0000 mg | ORAL_CAPSULE | Freq: Three times a day (TID) | ORAL | 0 refills | Status: DC

## 2021-07-30 NOTE — ED Provider Notes (Signed)
[facility]    CSN: [id number] Arrival date & time: 07/30/21  1149      History   Chief Complaint Chief Complaint  Patient presents with   Cough    HPI Reginald Gonzalez is a 49 y.o. male.   Patient here for evaluation of cough and generalized body aches that have been ongoing for the past 3 days.  Reports taking NyQuil with minimal symptom relief.  Denies any recent sick contacts.  Denies any trauma, injury, or other precipitating event.  Denies any specific alleviating or aggravating factors.  Denies any fevers, chest pain, shortness of breath, N/V/D, numbness, tingling, weakness, abdominal pain, or headaches.    The history is provided by the patient.  Cough Associated symptoms: myalgias    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/2020   NSTEMI (non-ST elevated myocardial infarction) (HCC) 03/09/2020   HLD (hyperlipidemia) 03/09/2020   Morbid obesity with BMI of 50.0-59.9, adult (HCC) 03/09/2020   Diabetes mellitus (HCC) 10/21/2011   HTN (hypertension) 10/21/2011    Past Surgical History:  Procedure Laterality Date   INTRAVASCULAR ULTRASOUND/IVUS N/A 03/09/2020   Procedure: Intravascular Ultrasound/IVUS;  Surgeon: Corky Crafts, MD;  Location: [facility] INVASIVE CV LAB;  Service: Cardiovascular;  Laterality: N/A;   LEFT HEART CATH AND CORONARY ANGIOGRAPHY N/A 03/09/2020   Procedure: LEFT HEART CATH AND CORONARY ANGIOGRAPHY;  Surgeon: Corky Crafts, MD;  Location: [facility] INVASIVE CV LAB;  Service: Cardiovascular;  Laterality: N/A;       Home Medications    Prior to Admission medications   Medication Sig Start Date End Date Taking? Authorizing Provider  aspirin EC 81 MG tablet Take 1 tablet (81 mg total) by mouth daily. Swallow whole. 06/20/20  Yes O'Neal, Ronnald Ramp, MD  atorvastatin (LIPITOR) 80 MG tablet  Take 1 tablet (80 mg total) by mouth at bedtime. 07/27/20  Yes O'Neal, Ronnald Ramp, MD  benzonatate (TESSALON) 100 MG capsule Take 1 capsule (100 mg total) by mouth every 8 (eight) hours. 07/30/21  Yes Ivette Loyal, NP  metFORMIN (GLUCOPHAGE) 500 MG tablet Take 1 tablet (500 mg total) by mouth daily with breakfast. 06/20/20  Yes O'Neal, Ronnald Ramp, MD  valsartan-hydrochlorothiazide (DIOVAN-HCT) 160-12.5 MG tablet TAKE 1 TABLET BY MOUTH EVERY DAY IN THE MORNING 01/12/21  Yes O'Neal, Ronnald Ramp, MD  acetaminophen (TYLENOL) 325 MG tablet Take 650 mg by mouth every 6 (six) hours as needed for mild pain or headache.    [provider]  amLODipine (NORVASC) 5 MG tablet Take 1 tablet (5 mg total) by mouth daily. 06/20/20   O'NealRonnald Ramp, MD  clotrimazole-betamethasone (LOTRISONE) cream Apply 1 application topically at bedtime as needed (for imflammation/redness- toes).  02/08/20   [provider]  doxycycline (VIBRAMYCIN) 100 MG capsule Take 1 capsule (100 mg total) by mouth 2 (two) times daily. 06/23/21   Particia Nearing, PA-C  metroNIDAZOLE (FLAGYL) 500 MG tablet Take 1 tablet (500 mg total) by mouth 2 (two) times daily. 05/24/21   Lamptey, Britta Mccreedy, MD  nitrofurantoin, macrocrystal-monohydrate, (MACROBID) 100 MG capsule Take 1 capsule (100 mg total) by mouth 2 (two) times daily. 04/22/21   Ivette Loyal, NP  phenazopyridine (PYRIDIUM) 200 MG tablet Take 1 tablet (200 mg total) by mouth 3 (three) times daily. 06/23/21   Particia Nearing, PA-C  tadalafil (CIALIS) 20 MG tablet Take 20 mg by mouth daily as needed for erectile dysfunction. 03/05/20   [provider]    Family History Family History  Problem Relation Age of Onset   Hypertension Mother    Healthy Father     Social History Social History   Tobacco Use   Smoking status: Former   Smokeless tobacco: Never   Tobacco comments:    Used to smoke Building surveyor Use   Vaping Use: Never  used  Substance Use Topics   Alcohol use: Yes    Comment: occasional   Drug use: No     Allergies   Patient has no known allergies.   Review of Systems Review of Systems  Respiratory:  Positive for cough.   Musculoskeletal:  Positive for myalgias.  All other systems reviewed and are negative.   Physical Exam Triage Vital Signs ED Triage Vitals  Enc Vitals Group     BP 07/30/21 1259 117/75     Pulse Rate 07/30/21 1259 90     Resp --      Temp 07/30/21 1259 98.1 F (36.7 C)     Temp Source 07/30/21 1259 Oral     SpO2 07/30/21 1259 95 %     Weight --      Height --      Head Circumference --      Peak Flow --      Pain Score 07/30/21 1304 7     Pain Loc --      Pain Edu? --      Excl. in GC? --    No data found.  Updated Vital Signs BP 117/75 (BP Location: Left Arm)   Pulse 90   Temp 98.1 F (36.7 C) (Oral)   SpO2 95%   Visual Acuity Right Eye Distance:   Left Eye Distance:   Bilateral Distance:    Right Eye Near:   Left Eye Near:    Bilateral Near:     Physical Exam Vitals and nursing note reviewed.  Constitutional:      General: He is not in acute distress.    Appearance: Normal appearance. He is not ill-appearing, toxic-appearing or diaphoretic.  HENT:     Head: Normocephalic and atraumatic.     Nose: Congestion present.     Right Turbinates: Swollen.     Left Turbinates: Swollen.     Mouth/Throat:     Pharynx: Pharyngeal swelling and posterior oropharyngeal erythema present. No oropharyngeal exudate or uvula swelling.     Tonsils: 1+ on the right. 1+ on the left.  Eyes:     Conjunctiva/sclera: Conjunctivae normal.  Cardiovascular:     Rate and Rhythm: Normal rate.     Pulses: Normal pulses.     Heart sounds: Normal heart sounds.  Pulmonary:     Effort: Pulmonary effort is normal.     Breath sounds: Normal breath sounds.  Abdominal:     General: Abdomen is flat.  Musculoskeletal:        General: Normal range of motion.     Cervical  back: Normal range of motion.  Skin:    General: Skin is warm and dry.  Neurological:     General: No focal deficit present.     Mental Status: He is alert and oriented to person, place, and time.  Psychiatric:        Mood and Affect: Mood normal.     UC Treatments / Results  Labs (all labs ordered are listed, but only abnormal results are displayed) Labs Reviewed  SARS CORONAVIRUS 2 (TAT 6-24 HRS)    EKG   Radiology No results found.  Procedures Procedures (including critical care time)  Medications Ordered in UC Medications - No data to display  Initial Impression / Assessment and Plan / UC Course  I have reviewed the triage vital signs and the nursing notes.  Pertinent labs & imaging results that were available during my care of the patient were reviewed by me and considered in my medical decision making (see chart for details).    Assessment was negative for red flags or concerns.  COVID test pending.  Likely viral illness with cough.  Discussed current CDC guidelines for quarantine if COVID test is positive.  May take [facility] as needed.  Tylenol and/or ibuprofen as needed.  Encourage fluids and rest.  Discussed conservative symptom management as described in discharge instructions.  Follow-up as needed Final Clinical Impressions(s) / UC Diagnoses   Final diagnoses:  Viral URI with cough     Discharge Instructions      We will contact you if your COVID test is positive.  Please quarantine while you wait for the results.  If your test is negative you may resume normal activities.  If your test is positive please continue to quarantine for at least 5 days from your symptom onset or until you are without a fever for at least 24 hours after the medications.  You can take the Tessalon perles as needed for cough.   You can take Tylenol and/or Ibuprofen as needed for fever reduction and pain relief.   For cough: honey 1/2 to 1 teaspoon (you can dilute the  honey in water or another fluid).  You can also use guaifenesin and dextromethorphan for cough. You can use a humidifier for chest congestion and cough.  If you don't have a humidifier, you can sit in the bathroom with the hot shower running.     For sore throat: try warm salt water gargles, cepacol lozenges, throat spray, warm tea or water with lemon/honey, popsicles or ice, or OTC cold relief medicine for throat discomfort.    For congestion: take a daily anti-histamine like Zyrtec, Claritin, and a oral decongestant, such as pseudoephedrine.  You can also use Flonase 1-2 sprays in each nostril daily.    It is important to stay hydrated: drink plenty of fluids (water, gatorade/powerade/pedialyte, juices, or teas) to keep your throat moisturized and help further relieve irritation/discomfort.   Return or go to the Emergency Department if symptoms worsen or do not improve in the next few days.      ED Prescriptions     Medication Sig Dispense Auth. Provider   benzonatate (TESSALON) 100 MG capsule Take 1 capsule (100 mg total) by mouth every 8 (eight) hours. 21 capsule Ivette Loyal, NP      PDMP not reviewed this encounter.   Ivette Loyal, NP 07/30/21 [phone]

## 2021-07-30 NOTE — ED Triage Notes (Signed)
C/O cough and generalized body aches onset 3 days ago.  Denies fevers.

## 2021-07-30 NOTE — Discharge Instructions (Addendum)
We will contact you if your COVID test is positive.  Please quarantine while you wait for the results.  If your test is negative you may resume normal activities.  If your test is positive please continue to quarantine for at least 5 days from your symptom onset or until you are without a fever for at least 24 hours after the medications.  You can take the Tessalon perles as needed for cough.   You can take Tylenol and/or Ibuprofen as needed for fever reduction and pain relief.   For cough: honey 1/2 to 1 teaspoon (you can dilute the honey in water or another fluid).  You can also use guaifenesin and dextromethorphan for cough. You can use a humidifier for chest congestion and cough.  If you don't have a humidifier, you can sit in the bathroom with the hot shower running.     For sore throat: try warm salt water gargles, cepacol lozenges, throat spray, warm tea or water with lemon/honey, popsicles or ice, or OTC cold relief medicine for throat discomfort.    For congestion: take a daily anti-histamine like Zyrtec, Claritin, and a oral decongestant, such as pseudoephedrine.  You can also use Flonase 1-2 sprays in each nostril daily.    It is important to stay hydrated: drink plenty of fluids (water, gatorade/powerade/pedialyte, juices, or teas) to keep your throat moisturized and help further relieve irritation/discomfort.   Return or go to the Emergency Department if symptoms worsen or do not improve in the next few days.

## 2021-07-31 LAB — SARS CORONAVIRUS 2 (TAT 6-24 HRS): SARS Coronavirus 2: POSITIVE — AB

## 2021-08-23 NOTE — Progress Notes (Signed)
Cardiology Office Note:   Date:  08/24/2021  NAME:  Reginald Gonzalez    MRN: [id number] DOB:  1971-01-26   PCP:  Mirna Mires, MD  Cardiologist:  Reatha Harps, MD  Electrophysiologist:  None   Referring MD: Mirna Mires, MD   Chief Complaint  Patient presents with   Follow-up    History of Present Illness:   Reginald Gonzalez is a 50 y.o. male with a hx of CAD, HTN, DM, apical variant HCM who presents for follow-up. He reports he is doing well.  BP 129/76.  He is continued on valsartan-HCTZ.  Denies any significant chest pain symptoms.  No trouble breathing.  Still working as a Naval architect.  He reports he does snore and is fatigued.  He has attempted to do a sleep study in the past but has not completed 1 due to cost.  I did discussed a home sleep study.  He is interested.  He is diabetic but stopped taking his metformin.  Informed him that he needs to get back on this medication.  Denies any significant lower extremity edema.  No syncopal episodes.  He also reports issues with erectile dysfunction.  He has tried Cialis in the past.  I did inform him we can try Viagra.  He may need a higher dose.  He is not exercising.  I have recommended he start this.  He really needs to lose weight.  BMI 51.  I informed him he can lift weights and go to the gym.  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  He also eats poorly.  Needs to work on this.  Problem List 1. Apical Variant Hypertrophic CM -3 day zio negative for VT -no syncope -no FH SCD -9% LGE 2. Non-obstructive CAD/NSTEMI -02/2020: 40% RCA, 50% mid LCX -vasospasm noticed during cath 3. DM -A1c 6.4 4. HTN 5. HLD -T chol 165, HDL 37, LDL 115, TG 67 6. Obesity (BMI 51)  Past Medical History: Past Medical History:  Diagnosis Date   Diabetes mellitus    Hypertension     Past Surgical History: Past Surgical History:  Procedure Laterality Date   INTRAVASCULAR ULTRASOUND/IVUS N/A 03/09/2020    Procedure: Intravascular Ultrasound/IVUS;  Surgeon: Corky Crafts, MD;  Location: [facility] INVASIVE CV LAB;  Service: Cardiovascular;  Laterality: N/A;   LEFT HEART CATH AND CORONARY ANGIOGRAPHY N/A 03/09/2020   Procedure: LEFT HEART CATH AND CORONARY ANGIOGRAPHY;  Surgeon: Corky Crafts, MD;  Location: [facility] INVASIVE CV LAB;  Service: Cardiovascular;  Laterality: N/A;    Current Medications: Current Meds  Medication Sig   acetaminophen (TYLENOL) 325 MG tablet Take 650 mg by mouth every 6 (six) hours as needed for mild pain or headache.   aspirin EC 81 MG tablet Take 1 tablet (81 mg total) by mouth daily. Swallow whole.   atorvastatin (LIPITOR) 40 MG tablet Take 1 tablet (40 mg total) by mouth daily.   sildenafil (VIAGRA) 25 MG tablet Take 1 tablet (25 mg total) by mouth daily as needed for erectile dysfunction.   [DISCONTINUED] valsartan-hydrochlorothiazide (DIOVAN-HCT) 160-12.5 MG tablet Take 1 tablet by mouth daily.     Allergies:    Patient has no known allergies.   Social History: Social History   Socioeconomic History   Marital status: Divorced    Spouse name: Not on file   Number of children: Not on file   Years of education: Not on file  Highest education level: Not on file  Occupational History   Not on file  Tobacco Use   Smoking status: Former   Smokeless tobacco: Never   Tobacco comments:    Used to smoke [employer]  Vaping Use   Vaping Use: Never used  Substance and Sexual Activity   Alcohol use: Yes    Comment: occasional   Drug use: No   Sexual activity: Not on file  Other Topics Concern   Not on file  Social History Narrative   Not on file   Social Determinants of Health   Financial Resource Strain: Not on file  Food Insecurity: Not on file  Transportation Needs: Not on file  Physical Activity: Not on file  Stress: Not on file  Social Connections: Not on file     Family History: The patient's family history includes Healthy in his  father; Hypertension in his mother.  ROS:   All other ROS reviewed and negative. Pertinent positives noted in the HPI.     EKGs/Labs/Other Studies Reviewed:   The following studies were personally reviewed by me today:  TTE 03/09/2020  1. Left ventricular ejection fraction, by estimation, is 60 to 65%. The  left ventricle has normal function. The left ventricle has no regional  wall motion abnormalities. Left ventricular diastolic parameters are  consistent with Grade II diastolic  dysfunction (pseudonormalization).   2. Right ventricular systolic function is normal. The right ventricular  size is normal.   3. The mitral valve is normal in structure. No evidence of mitral valve  regurgitation. No evidence of mitral stenosis.   4. The aortic valve is normal in structure. Aortic valve regurgitation is  not visualized. No aortic stenosis is present.   Zio 03/15/2020 1. No evidence of ventricular tachycardia.  2. Occasional PACs (1.2% burden).  3. No atrial fibrillation.   LHC 03/09/2020 Ost RCA lesion is 40% stenosed. No catheter dampening. Hazy, Mid Cx lesion is 40% stenosed. Cross sectional area by IVUS 6.24 mm. No dissection or thrombus noted. LV end diastolic pressure is moderately elevated. There is no aortic valve stenosis. Catheter induced vasospasm of the ostial left main, which resolved with IC NTG.  Recent Labs: No results found for requested labs within last 8760 hours.   Recent Lipid Panel    Component Value Date/Time   CHOL 201 (H) 07/25/2020 1508   TRIG 151 (H) 07/25/2020 1508   HDL 39 (L) 07/25/2020 1508   CHOLHDL 5.2 (H) 07/25/2020 1508   CHOLHDL 4.5 03/09/2020 0405   VLDL 13 03/09/2020 0405   LDLCALC 135 (H) 07/25/2020 1508    Physical Exam:   VS:  BP 129/76   Pulse 94   Ht 5\' 3"  (1.6 m)   Wt 290 lb 3.2 oz (131.6 kg)   SpO2 97%   BMI 51.41 kg/m    Wt Readings from Last 3 Encounters:  08/24/21 290 lb 3.2 oz (131.6 kg)  06/20/20 (!) 282 lb 3.2 oz  (128 kg)  03/17/20 274 lb 6.4 oz (124.5 kg)    General: Well nourished, well developed, in no acute distress Head: Atraumatic, normal size  Eyes: PEERLA, EOMI  Neck: Supple, no JVD Endocrine: No thryomegaly Cardiac: Normal S1, S2; RRR; no murmurs, rubs, or gallops Lungs: Clear to auscultation bilaterally, no wheezing, rhonchi or rales  Abd: Soft, nontender, no hepatomegaly  Ext: No edema, pulses 2+ Musculoskeletal: No deformities, BUE and BLE strength normal and equal Skin: Warm and dry, no rashes  Neuro: Alert and oriented to person, place, time, and situation, CNII-XII grossly intact, no focal deficits  Psych: Normal mood and affect   ASSESSMENT:   Reginald Gonzalez is a 50 y.o. male who presents for the following: 1. Apical variant hypertrophic cardiomyopathy (HCC)   2. Coronary artery disease involving native coronary artery of native heart without angina pectoris   3. Mixed hyperlipidemia   4. Essential hypertension   5. Controlled type 2 diabetes mellitus without complication, without long-term current use of insulin (HCC)   6. Erectile dysfunction, unspecified erectile dysfunction type   7. Obesity, morbid, BMI 50 or higher (HCC)   8. Snoring   9. Chronic fatigue     PLAN:   1. Apical variant hypertrophic cardiomyopathy (HCC) -Admitted to the hospital with chest pain symptoms.  Found to have nonobstructive CAD.  Possibly related to coronary vasospasm.  No further symptoms of this.  He was not have apical variant hypertrophic cardiomyopathy.  Late gadolinium enhancement less than 15%.  Monitor without any VT.  No red flag symptoms such as syncope.  He continues to do quite well.  We will continue with restratification for sudden cardiac death every 2 to 3 years.  Likely will repeat testing next year.  2. Coronary artery disease involving native coronary artery of native heart without angina pectoris 3. Mixed hyperlipidemia -Nonobstructive CAD on left heart cath.  Continue  aspirin.  Get him back on Lipitor 40 mg daily.  4. Essential hypertension -BP well controlled on the losartan 160 mg daily.  He is also on HCTZ 12.5 daily.  5. Controlled type 2 diabetes mellitus without complication, without long-term current use of insulin (HCC) -Restart metformin 500 mg daily.  A1c 6.6.  He needs to see his PCP.  6. Erectile dysfunction, unspecified erectile dysfunction type -We will try him on sildenafil 25 mg as needed.  He may need increased dose.  He will let us know.  Also will discuss with primary care.  7. Obesity, morbid, BMI 50 or higher (HCC) -Weight loss recommended.  8. Snoring 9. Chronic fatigue -He reports snoring and fatigue.  Needs a sleep study.  We will set him up for home sleep study.      Disposition: Return in about 1 year (around 08/24/2022).  Medication Adjustments/Labs and Tests Ordered: Current medicines are reviewed at length with the patient today.  Concerns regarding medicines are outlined above.  Orders Placed This Encounter  Procedures   Home sleep test    Meds ordered this encounter  Medications   valsartan-hydrochlorothiazide (DIOVAN-HCT) 160-12.5 MG tablet    Sig: Take 1 tablet by mouth daily.    Dispense:  90 tablet    Refill:  1   atorvastatin (LIPITOR) 40 MG tablet    Sig: Take 1 tablet (40 mg total) by mouth daily.    Dispense:  90 tablet    Refill:  3   metFORMIN (GLUCOPHAGE) 500 MG tablet    Sig: Take 1 tablet (500 mg total) by mouth daily with breakfast.    Dispense:  90 tablet    Refill:  1   sildenafil (VIAGRA) 25 MG tablet    Sig: Take 1 tablet (25 mg total) by mouth daily as needed for erectile dysfunction.    Dispense:  15 tablet    Refill:  0     Patient Instructions  Medication Instructions:   Start Valsartan-HCTZ  Start Lipitor 40 mg daily  Start Metformin 500 mg daily with breakfast Start Viagra  25 mg as needed Continue Aspirin  *If you need a refill on your cardiac medications before your  next appointment, please call your pharmacy*   Testing/Procedures: Your physician has recommended that you have a sleep study. This test records several body functions during sleep, including: brain activity, eye movement, oxygen and carbon dioxide blood levels, heart rate and rhythm, breathing rate and rhythm, the flow of air through your mouth and nose, snoring, body muscle movements, and chest and belly movement.   Follow-Up: At [facility], you and your health needs are our priority.  As part of our continuing mission to provide you with exceptional heart care, we have created designated Provider Care Teams.  These Care Teams include your primary Cardiologist (physician) and Advanced Practice Providers (APPs -  Physician Assistants and Nurse Practitioners) who all work together to provide you with the care you need, when you need it.  We recommend signing up for the patient portal called "MyChart".  Sign up information is provided on this After Visit Summary.  MyChart is used to connect with patients for Virtual Visits (Telemedicine).  Patients are able to view lab/test results, encounter notes, upcoming appointments, etc.  Non-urgent messages can be sent to your provider as well.   To learn more about what you can do with MyChart, go to [email or web address].    Your next appointment:   12 month(s)  The format for your next appointment:   In Person  Provider:   Lennie Odor, MD  Please make sure to follow up with your primary care provider.    Time Spent with Patient: I have spent a total of 35 minutes with patient reviewing hospital notes, telemetry, EKGs, labs and examining the patient as well as establishing an assessment and plan that was discussed with the patient.  > 50% of time was spent in direct patient care.  Signed, Lenna Gilford. Flora Lipps, MD, [facility]  [facility]  [address] [place], [place] [address] [phone]  08/24/2021 11:00 AM

## 2021-08-24 ENCOUNTER — Encounter: Payer: Self-pay | Admitting: Cardiovascular Disease

## 2021-08-24 ENCOUNTER — Other Ambulatory Visit: Payer: Self-pay

## 2021-08-24 ENCOUNTER — Ambulatory Visit: Payer: BC Managed Care – PPO | Admitting: Cardiovascular Disease

## 2021-08-24 VITALS — BP 129/76 | HR 94 | Ht 63.0 in | Wt 290.2 lb

## 2021-08-24 DIAGNOSIS — R0683 Snoring: Secondary | ICD-10-CM

## 2021-08-24 DIAGNOSIS — I10 Essential (primary) hypertension: Secondary | ICD-10-CM

## 2021-08-24 DIAGNOSIS — E782 Mixed hyperlipidemia: Secondary | ICD-10-CM | POA: Diagnosis not present

## 2021-08-24 DIAGNOSIS — I422 Other hypertrophic cardiomyopathy: Secondary | ICD-10-CM | POA: Diagnosis not present

## 2021-08-24 DIAGNOSIS — I2510 Atherosclerotic heart disease of native coronary artery without angina pectoris: Secondary | ICD-10-CM | POA: Diagnosis not present

## 2021-08-24 DIAGNOSIS — N529 Male erectile dysfunction, unspecified: Secondary | ICD-10-CM

## 2021-08-24 DIAGNOSIS — R5382 Chronic fatigue, unspecified: Secondary | ICD-10-CM

## 2021-08-24 DIAGNOSIS — E119 Type 2 diabetes mellitus without complications: Secondary | ICD-10-CM

## 2021-08-24 MED ORDER — ATORVASTATIN CALCIUM 40 MG PO TABS: 40.0000 mg | ORAL_TABLET | Freq: Every day | ORAL | 3 refills | Status: DC

## 2021-08-24 MED ORDER — SILDENAFIL CITRATE 25 MG PO TABS: 25.0000 mg | ORAL_TABLET | Freq: Every day | ORAL | 0 refills | Status: DC | PRN

## 2021-08-24 MED ORDER — VALSARTAN-HYDROCHLOROTHIAZIDE 160-12.5 MG PO TABS: 1.0000 | ORAL_TABLET | Freq: Every day | ORAL | 1 refills | Status: DC

## 2021-08-24 MED ORDER — METFORMIN HCL 500 MG PO TABS: 500.0000 mg | ORAL_TABLET | Freq: Every day | ORAL | 1 refills | Status: DC

## 2021-08-24 NOTE — Patient Instructions (Addendum)
Medication Instructions:   Start Valsartan-HCTZ  Start Lipitor 40 mg daily  Start Metformin 500 mg daily with breakfast Start Viagra 25 mg as needed Continue Aspirin  *If you need a refill on your cardiac medications before your next appointment, please call your pharmacy*   Testing/Procedures: Your physician has recommended that you have a sleep study. This test records several body functions during sleep, including: brain activity, eye movement, oxygen and carbon dioxide blood levels, heart rate and rhythm, breathing rate and rhythm, the flow of air through your mouth and nose, snoring, body muscle movements, and chest and belly movement.   Follow-Up: At [facility], you and your health needs are our priority.  As part of our continuing mission to provide you with exceptional heart care, we have created designated Provider Care Teams.  These Care Teams include your primary Cardiologist (physician) and Advanced Practice Providers (APPs -  Physician Assistants and Nurse Practitioners) who all work together to provide you with the care you need, when you need it.  We recommend signing up for the patient portal called "MyChart".  Sign up information is provided on this After Visit Summary.  MyChart is used to connect with patients for Virtual Visits (Telemedicine).  Patients are able to view lab/test results, encounter notes, upcoming appointments, etc.  Non-urgent messages can be sent to your provider as well.   To learn more about what you can do with MyChart, go to [email or web address].    Your next appointment:   12 month(s)  The format for your next appointment:   In Person  Provider:   Lennie Odor, MD  Please make sure to follow up with your primary care provider.

## 2021-08-25 ENCOUNTER — Telehealth: Payer: Self-pay | Admitting: *Deleted

## 2021-08-25 NOTE — Telephone Encounter (Signed)
Prior Authorization for [employer] sent to [place] [employer] via [employer] . [phone].

## 2021-08-25 NOTE — Telephone Encounter (Signed)
-----   Message from Darene Lamer, LPN sent at 2/45/8099  4:43 PM EDT ----- Dr.O'Neal ordered a home sleep study on this patient, just wanted you to know.   Thanks!

## 2021-08-28 NOTE — Telephone Encounter (Signed)
Auth received via phone message. Auth # [id number]. Valid dates 9/30/222 to 09/24/21. Appointment scheduled on 09/22/21 @ 11. Patient notified of appointment details.

## 2021-09-02 ENCOUNTER — Other Ambulatory Visit: Payer: Self-pay | Admitting: Family Medicine

## 2021-09-22 ENCOUNTER — Ambulatory Visit (HOSPITAL_BASED_OUTPATIENT_CLINIC_OR_DEPARTMENT_OTHER): Payer: BC Managed Care – PPO | Attending: Cardiovascular Disease | Admitting: Cardiovascular Disease

## 2021-09-22 ENCOUNTER — Other Ambulatory Visit: Payer: Self-pay

## 2021-09-22 DIAGNOSIS — G4733 Obstructive sleep apnea (adult) (pediatric): Secondary | ICD-10-CM | POA: Insufficient documentation

## 2021-09-22 DIAGNOSIS — G4736 Sleep related hypoventilation in conditions classified elsewhere: Secondary | ICD-10-CM | POA: Diagnosis not present

## 2021-09-22 DIAGNOSIS — R0683 Snoring: Secondary | ICD-10-CM

## 2021-09-22 DIAGNOSIS — R5382 Chronic fatigue, unspecified: Secondary | ICD-10-CM | POA: Diagnosis not present

## 2021-09-23 ENCOUNTER — Other Ambulatory Visit (HOSPITAL_COMMUNITY)
Admission: RE | Admit: 2021-09-23 | Discharge: 2021-09-23 | Disposition: A | Discharge status: Home / Self Care | Payer: BC Managed Care – PPO | Source: Ambulatory Visit | Attending: Family Medicine | Admitting: Family Medicine

## 2021-09-23 ENCOUNTER — Other Ambulatory Visit: Payer: Self-pay | Admitting: Family Medicine

## 2021-09-23 DIAGNOSIS — Z23 Encounter for immunization: Secondary | ICD-10-CM | POA: Diagnosis not present

## 2021-09-23 DIAGNOSIS — E669 Obesity, unspecified: Secondary | ICD-10-CM | POA: Diagnosis not present

## 2021-09-23 DIAGNOSIS — A64 Unspecified sexually transmitted disease: Secondary | ICD-10-CM | POA: Diagnosis not present

## 2021-09-23 DIAGNOSIS — I10 Essential (primary) hypertension: Secondary | ICD-10-CM | POA: Diagnosis not present

## 2021-09-23 DIAGNOSIS — Z113 Encounter for screening for infections with a predominantly sexual mode of transmission: Secondary | ICD-10-CM | POA: Diagnosis not present

## 2021-09-23 DIAGNOSIS — E785 Hyperlipidemia, unspecified: Secondary | ICD-10-CM | POA: Diagnosis not present

## 2021-09-23 DIAGNOSIS — E1169 Type 2 diabetes mellitus with other specified complication: Secondary | ICD-10-CM | POA: Diagnosis not present

## 2021-09-26 LAB — MOLECULAR ANCILLARY ONLY
Bacterial Vaginitis (gardnerella): NEGATIVE
Candida Glabrata: NEGATIVE
Candida Vaginitis: NEGATIVE
Chlamydia: NEGATIVE
Comment: NEGATIVE
Comment: NEGATIVE
Comment: NEGATIVE
Comment: NEGATIVE
Comment: NEGATIVE
Comment: NORMAL
Neisseria Gonorrhea: NEGATIVE
Trichomonas: NEGATIVE

## 2021-10-12 ENCOUNTER — Encounter (HOSPITAL_BASED_OUTPATIENT_CLINIC_OR_DEPARTMENT_OTHER): Payer: Self-pay | Admitting: Cardiovascular Disease

## 2021-10-12 NOTE — Procedures (Signed)
    Patient Name: Reginald Gonzalez, Reginald Gonzalez Date: 09/24/2021 Gender: Male D.O.B: 30-Jul-1971 Age (years): 50 Referring Provider: Ronnald Ramp ONeal Height (inches): 63 Interpreting Physician: Nicki Guadalajara MD, ABSM Weight (lbs): 290 RPSGT: [place] BMI: 51 MRN: [id number] Neck Size: 18.50  CLINICAL INFORMATION Sleep Study Type: HST  Indication for sleep study: snoring, fatigue, morbid obesity  Epworth Sleepiness Score: 7  SLEEP STUDY TECHNIQUE A multi-channel overnight portable sleep study was performed. The channels recorded were: nasal airflow, thoracic respiratory movement, and oxygen saturation with a pulse oximetry. Snoring was also monitored.  MEDICATIONS acetaminophen (TYLENOL) 325 MG tablet aspirin EC 81 MG tablet atorvastatin (LIPITOR) 40 MG tablet metFORMIN (GLUCOPHAGE) 500 MG tablet sildenafil (VIAGRA) 25 MG tablet valsartan-hydrochlorothiazide (DIOVAN-HCT) 160-12.5 MG tablet   Patient self administered medications include: N/A.  SLEEP ARCHITECTURE Patient was studied for 387 minutes. The sleep efficiency was 100.0 % and the patient was supine for 69.4%. The arousal index was 0.0 per hour.  RESPIRATORY PARAMETERS The overall AHI was 34.9 per hour, with a central apnea index of 0 per hour.  The oxygen nadir was 71% during sleep.  CARDIAC DATA Mean heart rate during sleep was 91.3 bpm.  IMPRESSIONS - Severe obstructive sleep apnea occurred during this study (AHI 34.9/h); events were more severe during REM sleep (AHI 44.5). The severity during REM sleep cannot be assessed on this home study.  - Severe oxygen desaturation to a nadir of 71%. - Patient snored 26.5% during the sleep.  DIAGNOSIS - Obstructive Sleep Apnea (G47.33) - Nocturnal Hypoxemia (G47.36)  RECOMMENDATIONS - In this patint with significant cardiovascular comorbidities and severe oxygen desaturation suggestive of marked REM sleep desaturation, recommend an in-lab titration study.   If unable to obtain an in-lab study, initiate Auto-PAP with EPR of 3 at 7 - 20 cm of water. - Effort should be made to optimize nasal and oropharyngeal patency.  - Positional therapy avoiding supine position during sleep. - Avoid alcohol, sedatives and other CNS depressants that may worsen sleep apnea and disrupt normal sleep architecture. - Sleep hygiene should be reviewed to assess factors that may improve sleep quality. - Weight management (BMI 51) and regular exercise should be initiated or continued. - Recommend a download and sleep clinic evaluation after one month of therapy. -  [Electronically signed] 10/12/2021 08:12 AM  Nicki Guadalajara MD, [facility], ABSM Diplomate, American Board of Sleep Medicine   NPI: [id number]  [facility] PH: [phone]   FX: [phone] ACCREDITED BY THE AMERICAN ACADEMY OF SLEEP MEDICINE

## 2021-10-27 ENCOUNTER — Telehealth: Payer: Self-pay | Admitting: *Deleted

## 2021-10-27 NOTE — Telephone Encounter (Signed)
Patient notified of HST results and recommendations. He voiced verbal understanding and agrees to having CPAP titration.

## 2021-10-27 NOTE — Telephone Encounter (Signed)
-----   Message from Lennette Bihari, MD sent at 10/12/2021  8:19 AM EST ----- Reginald Gonzalez please notify pt of results;  try for CPAP titration study, if unable, then Auto-PAP

## 2021-11-01 ENCOUNTER — Other Ambulatory Visit: Payer: Self-pay | Admitting: Cardiovascular Disease

## 2021-11-01 ENCOUNTER — Telehealth: Payer: Self-pay | Admitting: *Deleted

## 2021-11-01 DIAGNOSIS — G4733 Obstructive sleep apnea (adult) (pediatric): Secondary | ICD-10-CM

## 2021-11-01 NOTE — Telephone Encounter (Signed)
Prior Authorization for CPAP titration sent to [employer] via [employer] # [phone].

## 2021-11-01 NOTE — Telephone Encounter (Signed)
-----   Message from Gaynelle Cage, CMA sent at 10/27/2021 11:36 AM EST ----- CPAP titration

## 2021-11-02 NOTE — Telephone Encounter (Signed)
Received a call from [employer] approving CPAP titration sleep study. Auth # [id number]. Valid dates 12/13/21 to 01/13/22.

## 2021-11-16 ENCOUNTER — Telehealth: Payer: Self-pay | Admitting: Cardiovascular Disease

## 2021-11-16 NOTE — Telephone Encounter (Signed)
I have not seen anything for this patient.   

## 2021-11-16 NOTE — Telephone Encounter (Signed)
° °  Pt said he faxed paperwork for [employer], he said he faxed it today

## 2021-11-17 NOTE — Telephone Encounter (Signed)
Contacted patient, advised I had not seen any paperwork- asked him to refax to my attention. Gave fax number for office.  Patient thankful for call back. I will update patient if not received next week.   Patient verbalized understanding.

## 2021-11-21 NOTE — Telephone Encounter (Signed)
pt on the line reaching out to see if the fax he sent over the weekend has been received.. please advise

## 2021-11-21 NOTE — Telephone Encounter (Signed)
Received paperwork from patient- he was advised I would have to wait until Dr.O'Neal was in the office to sign and review.  Patient verbalized understanding.

## 2021-11-24 NOTE — Telephone Encounter (Signed)
Spoke with Linus Galas- explained that I had the paperwork signed, I will have it faxed off and he can come pick it up if he wishes.   She notified patient, thanks!

## 2021-11-24 NOTE — Telephone Encounter (Signed)
Reginald Gonzalez is calling wanting to see if Dr. Flora Lipps was able to sign his paperwork for his job. Please advise.

## 2021-11-25 IMAGING — CT CT ANGIO CHEST
2 of 7 series · 18 of 46 positions shown · IV contrast (APPLIED)
Comparison: 03/08/2020

CLINICAL DATA: Shortness of breath, chest pain, elevated troponin,
centralized chest pain for 2 days

EXAM:
CT ANGIOGRAPHY CHEST WITH CONTRAST
TECHNIQUE: Multidetector CT imaging of the chest was performed using the
standard protocol during bolus administration of intravenous
contrast. Multiplanar CT image reconstructions and MIPs were
obtained to evaluate the vascular anatomy.
CONTRAST:  75mL OMNIPAQUE IOHEXOL 350 MG/ML SOLN

[Series 7: thins · axial · 0.98mm/px · z∈[+559,+818]mm · 15 of 417 slices shown]
[im 24/417  lung]
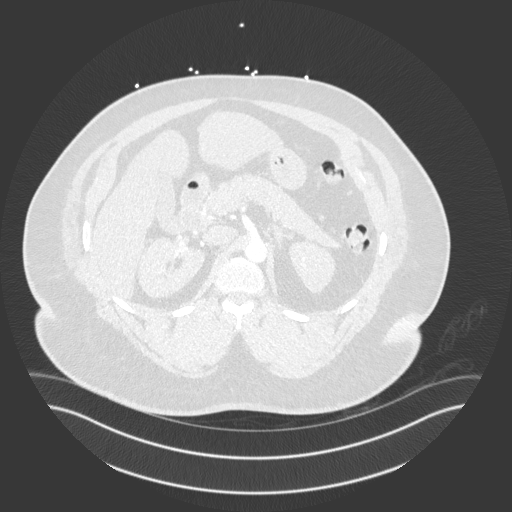
[im 47/417  soft-tissue]
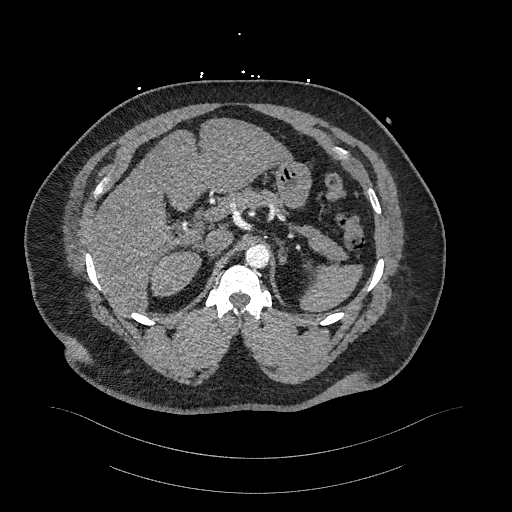
[im 70/417  lung]
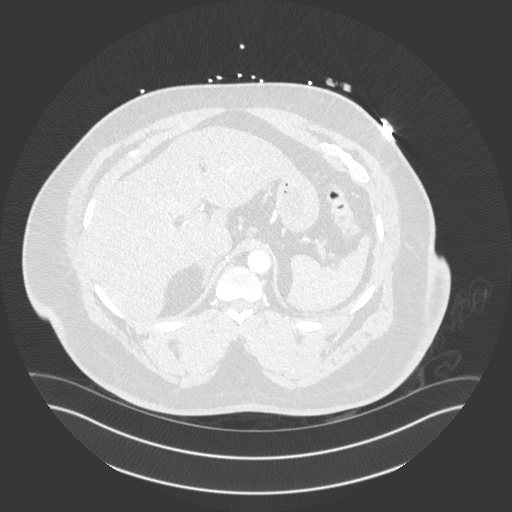
[im 93/417  soft-tissue]
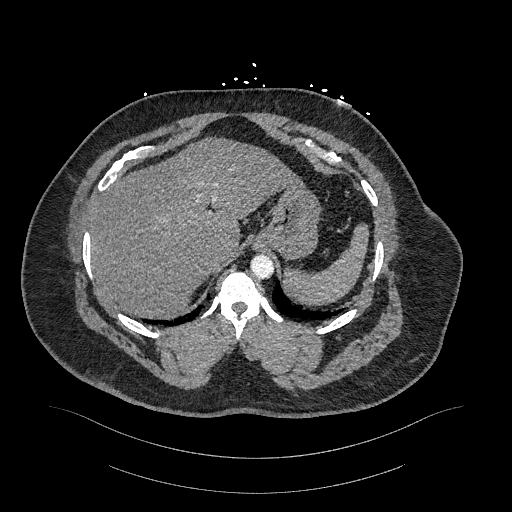
[im 139/417  lung]
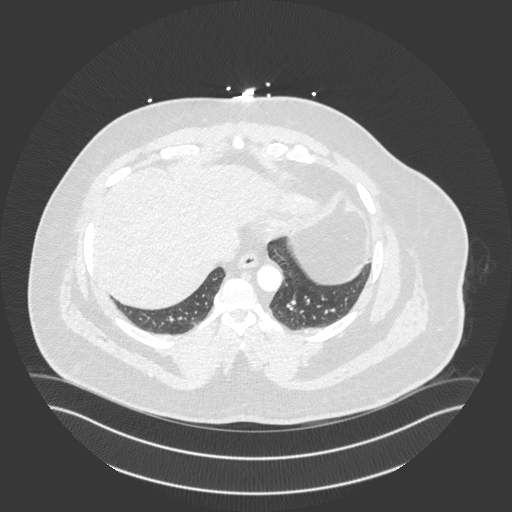
[im 162/417  soft-tissue]
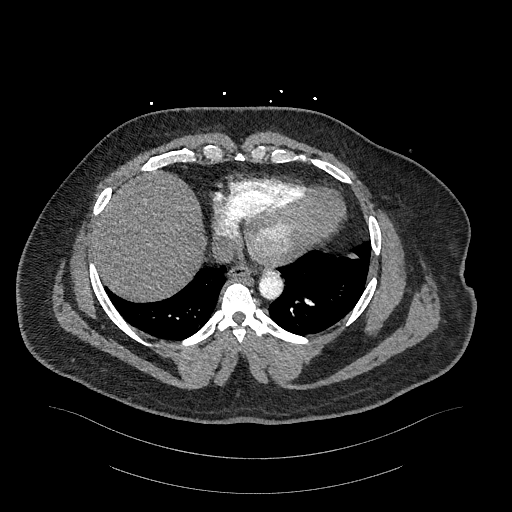
[im 185/417  lung]
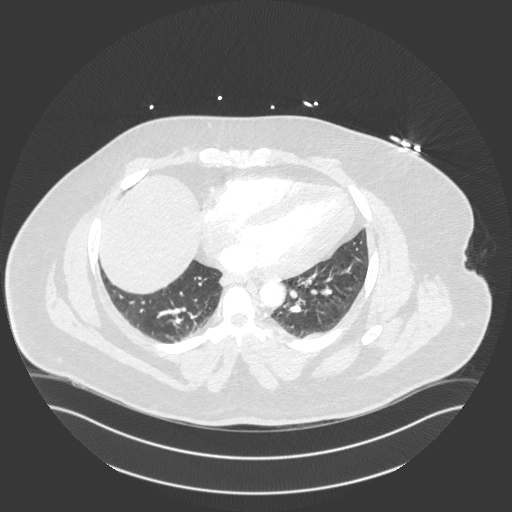
[im 209/417  soft-tissue]
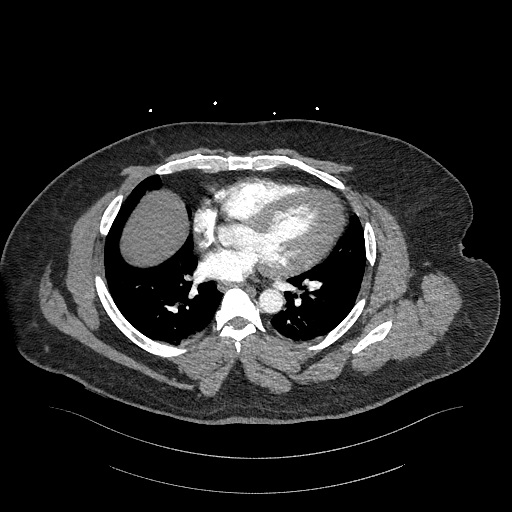
[im 232/417  lung]
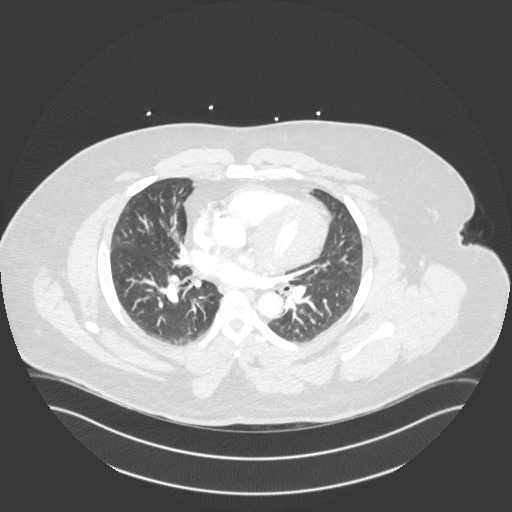
[im 255/417  soft-tissue]
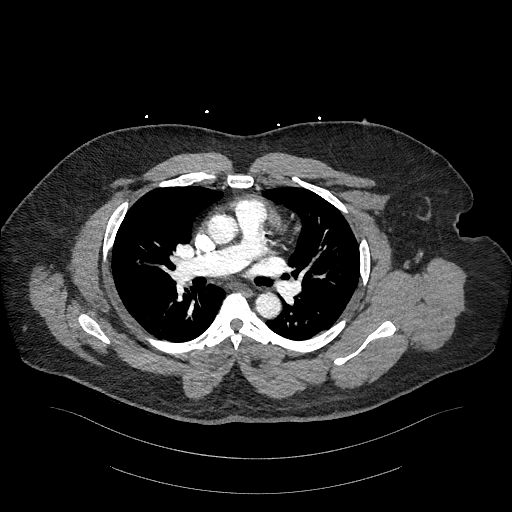
[im 278/417  lung]
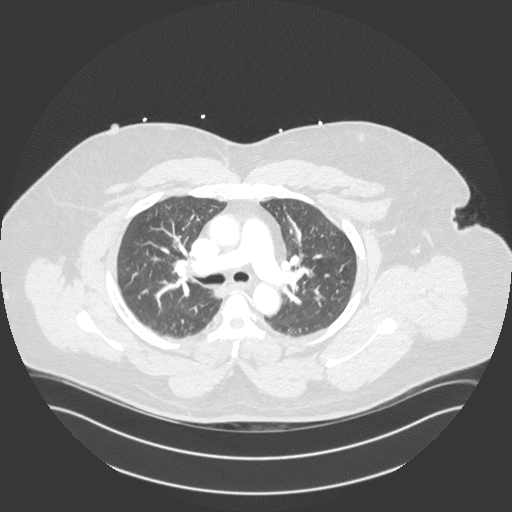
[im 324/417  soft-tissue]
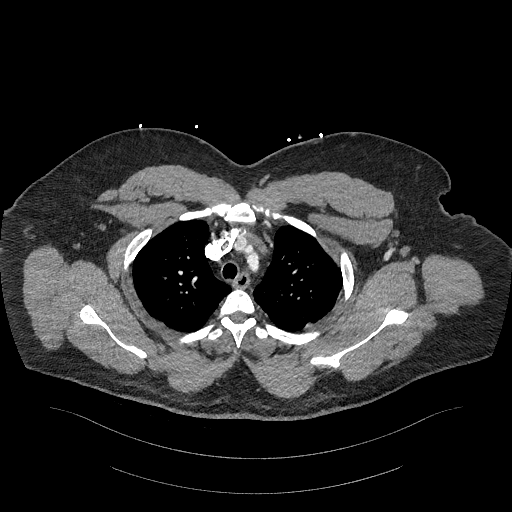
[im 347/417  lung]
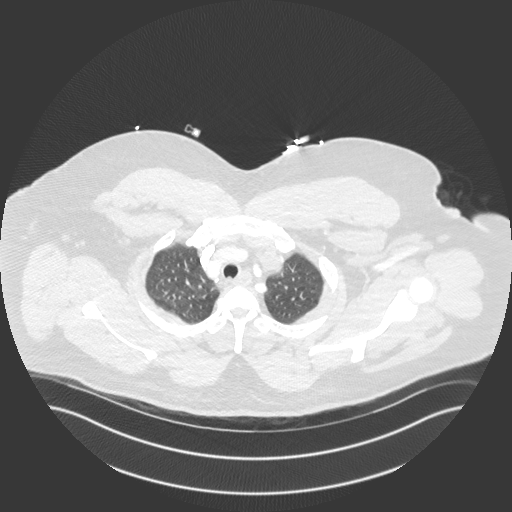
[im 370/417  soft-tissue]
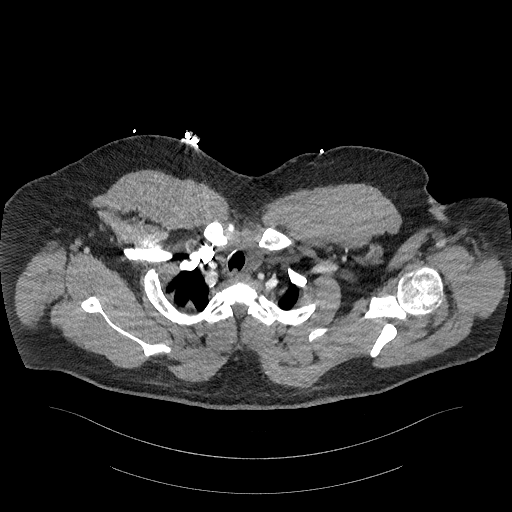
[im 393/417  lung]
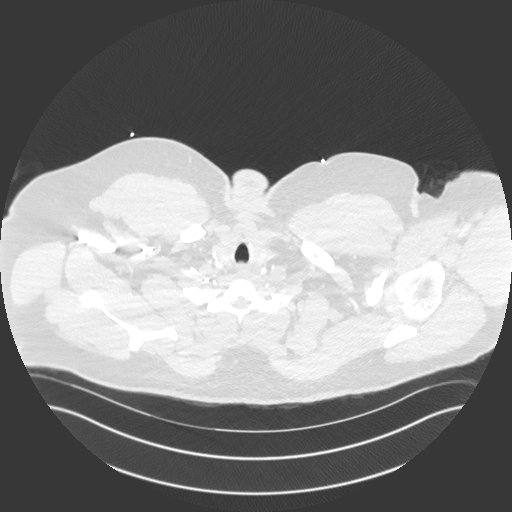

[Series 8: cor · coronal · 0.56mm/px · 3 of 175 slices shown]
[im 44/175  soft-tissue]
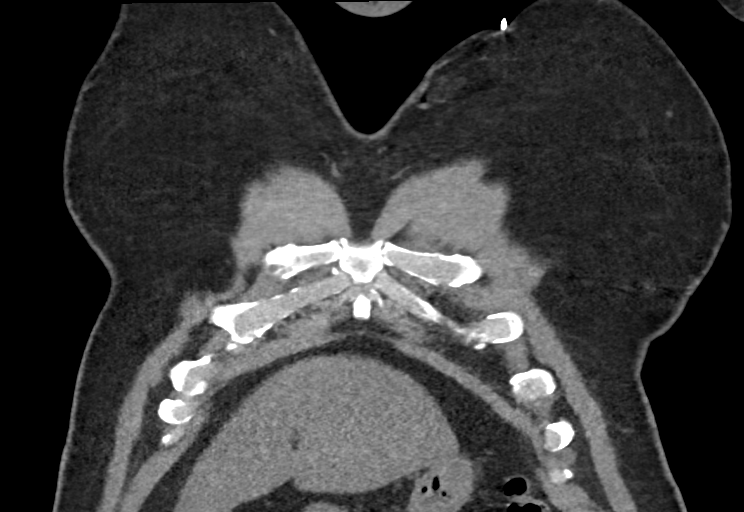
[im 88/175  soft-tissue]
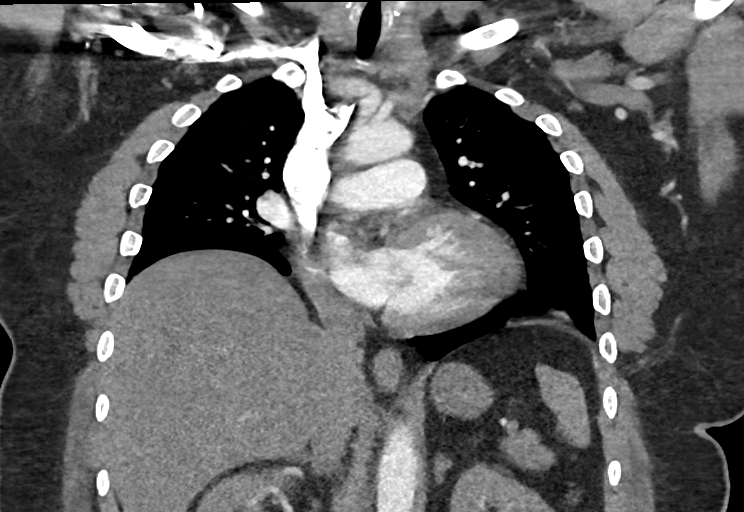
[im 131/175  soft-tissue]
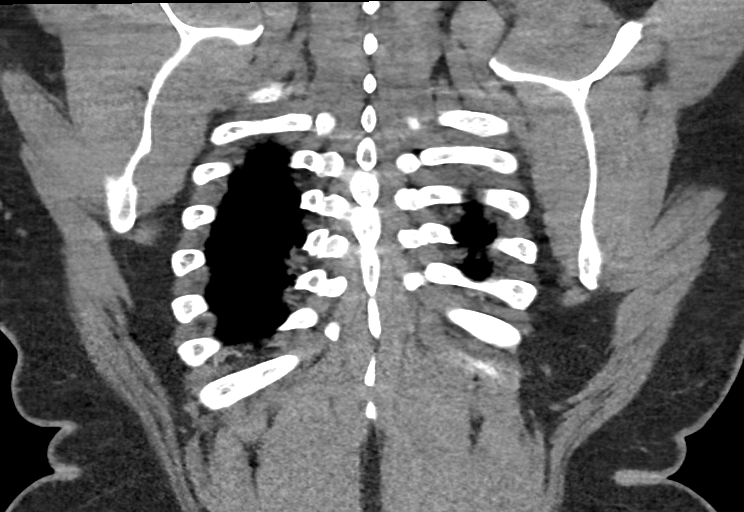

[18 of 46 positions shown; findings below may reference images not displayed]

FINDINGS: Cardiovascular: This is a technically adequate evaluation of the
pulmonary vasculature. No filling defects or pulmonary emboli.

The heart is unremarkable without pericardial effusion. Minimal
atherosclerosis within the left main coronary artery and LAD
distribution. Thoracic aorta is normal, without aneurysm or
dissection.

Mediastinum/Nodes: No enlarged mediastinal, hilar, or axillary lymph
nodes. Thyroid gland, trachea, and esophagus demonstrate no
significant findings.

Lungs/Pleura: No airspace disease, effusion, or pneumothorax.
Minimal hypoventilatory changes at the lung bases. Central airways
are patent.

Upper Abdomen: No acute abnormality.

Musculoskeletal: No acute or destructive bony lesions. Reconstructed
images demonstrate no additional findings.

Review of the MIP images confirms the above findings.
IMPRESSION: 1. No evidence of pulmonary embolus.
2. No acute intrathoracic process.
3. Mild coronary artery atherosclerosis.

## 2021-12-04 ENCOUNTER — Other Ambulatory Visit: Payer: Self-pay

## 2021-12-04 DIAGNOSIS — I422 Other hypertrophic cardiomyopathy: Secondary | ICD-10-CM

## 2021-12-14 ENCOUNTER — Other Ambulatory Visit: Payer: Self-pay

## 2021-12-14 ENCOUNTER — Ambulatory Visit (HOSPITAL_BASED_OUTPATIENT_CLINIC_OR_DEPARTMENT_OTHER): Payer: BC Managed Care – PPO | Attending: Cardiovascular Disease | Admitting: Cardiovascular Disease

## 2021-12-14 DIAGNOSIS — G4733 Obstructive sleep apnea (adult) (pediatric): Secondary | ICD-10-CM | POA: Diagnosis not present

## 2021-12-18 ENCOUNTER — Other Ambulatory Visit: Payer: Self-pay

## 2021-12-18 ENCOUNTER — Ambulatory Visit (HOSPITAL_COMMUNITY): Payer: BC Managed Care – PPO | Attending: Internal Medicine

## 2021-12-18 DIAGNOSIS — I422 Other hypertrophic cardiomyopathy: Secondary | ICD-10-CM | POA: Diagnosis present

## 2021-12-18 LAB — ECHOCARDIOGRAM COMPLETE
Area-P 1/2: 5.62 cm2
S' Lateral: 2 cm

## 2021-12-18 MED ORDER — PERFLUTREN LIPID MICROSPHERE
1.0000 mL | INTRAVENOUS | Status: AC | PRN
  Administered 2021-12-18 (×2): 3 mL via INTRAVENOUS

## 2021-12-19 ENCOUNTER — Telehealth: Payer: Self-pay | Admitting: Cardiovascular Disease

## 2021-12-19 NOTE — Telephone Encounter (Signed)
Patient is calling requesting the results of his Echo that was performed yesterday.

## 2021-12-19 NOTE — Telephone Encounter (Signed)
Spoke with Pt and informed that results are not available at this time. Will forward to ordering provider to result.

## 2021-12-20 NOTE — Telephone Encounter (Signed)
Pt reaching out to provide an e-mail address to send results to:  [email or web address]

## 2021-12-20 NOTE — Telephone Encounter (Signed)
Advised patient that unfortunately Echo results could not be forwarded through Email. Patient would like Echo results printed and left at front desk of [facility] office for him to pick up. Patient aware the letter with results is at front desk to pick up.   Advised patient to call back to office with any issues, questions, or concerns. Patient verbalized understanding.

## 2021-12-25 ENCOUNTER — Telehealth: Payer: Self-pay | Admitting: Cardiovascular Disease

## 2021-12-25 NOTE — Telephone Encounter (Signed)
Patient would like to know when he will get the results to his sleep test.

## 2021-12-26 ENCOUNTER — Encounter: Payer: Self-pay | Admitting: Cardiovascular Disease

## 2021-12-26 ENCOUNTER — Telehealth: Payer: Self-pay | Admitting: Cardiovascular Disease

## 2021-12-26 ENCOUNTER — Telehealth: Payer: Self-pay

## 2021-12-26 NOTE — Telephone Encounter (Signed)
Spoke with patient who stated he has been out of work for 1 month and needs to return to work. He is waiting on sleep study report for clearance to return to work. This message was sent to Dr. Tresa Endo and Dr. Val EagleJennette Kettle.

## 2021-12-26 NOTE — Telephone Encounter (Signed)
Ew Message:    Patient would like to know the update on his machine please.

## 2021-12-26 NOTE — Telephone Encounter (Signed)
Hello,  I spoke with the patient who stated he needs to get back to work after being out for 1 month. He stated the only thing keeping him from returning to work is his sleep study/apnea clearance. Please advise.

## 2021-12-27 NOTE — Telephone Encounter (Signed)
I have spoken with this patient twice and informed him that Dr. Tresa Endo hasn't read his study yet. Once it has been read he will be contacted with the results and recommendations. Dr Tresa Endo cannot read it before the technicians complete their portion. He just had it done January 23, and it is possible that it's not ready for him to interpret. I gave Dr Tresa Endo the patient's name on Friday Jan 28 th and Monday Jan 30th to read if it's ready. It still has not been done yet. I have no new information to give the patient.

## 2021-12-27 NOTE — Telephone Encounter (Signed)
The study will be read by tomorrow

## 2021-12-28 ENCOUNTER — Telehealth: Payer: Self-pay | Admitting: *Deleted

## 2021-12-28 ENCOUNTER — Encounter (HOSPITAL_BASED_OUTPATIENT_CLINIC_OR_DEPARTMENT_OTHER): Payer: Self-pay | Admitting: Cardiovascular Disease

## 2021-12-28 NOTE — Procedures (Signed)
Patient Name: Reginald Gonzalez, Reginald Gonzalez Date: 12/14/2021 Gender: Male D.O.B: [birth year] Age (years): 50 Referring Provider: Cassie Freer ONeal Height (inches): 63 Interpreting Physician: Shelva Majestic MD, ABSM Weight (lbs): 290 RPSGT: [place] BMI: 51 MRN: [id number] Neck Size: 18.50  CLINICAL INFORMATION The patient is referred for a CPAP titration to treat sleep apnea.  Date of HST: 09/24/2021: AHI 34.9/h: O2 nadir 71%.  SLEEP STUDY TECHNIQUE As per the AASM Manual for the Scoring of Sleep and Associated Events v2.3 (April 2016) with a hypopnea requiring 4% desaturations.  The channels recorded and monitored were frontal, central and occipital EEG, electrooculogram (EOG), submentalis EMG (chin), nasal and oral airflow, thoracic and abdominal wall motion, anterior tibialis EMG, snore microphone, electrocardiogram, and pulse oximetry. Continuous positive airway pressure (CPAP) was initiated at the beginning of the study and titrated to treat sleep-disordered breathing.  MEDICATIONS acetaminophen (TYLENOL) 325 MG tablet aspirin EC 81 MG tablet atorvastatin (LIPITOR) 40 MG tablet (Expired) metFORMIN (GLUCOPHAGE) 500 MG tablet sildenafil (VIAGRA) 25 MG tablet valsartan-hydrochlorothiazide (DIOVAN-HCT) 160-12.5 MG tablet  Medications self-administered by patient taken the night of the study : N/A  TECHNICIAN COMMENTS Comments added by technician: Patient was restless all through the night. Comments added by scorer: N/A  RESPIRATORY PARAMETERS Optimal PAP Pressure (cm): 12 AHI at Optimal Pressure (/hr): 0 Overall Minimal O2 (%): 87.0 Supine % at Optimal Pressure (%): 100 Minimal O2 at Optimal Pressure (%): 92.0   SLEEP ARCHITECTURE The study was initiated at 11:09:08 PM and ended at 5:42:55 AM.  Sleep onset time was 11.4 minutes and the sleep efficiency was 75.5%%. The total sleep time was 297.5 minutes.  The patient spent 9.9%% of the night in stage N1 sleep,  67.6%% in stage N2 sleep, 0.0%% in stage N3 and 22.5% in REM.Stage REM latency was 54.0 minutes  Wake after sleep onset was 84.9. Alpha intrusion was absent. Supine sleep was 69.37%.  CARDIAC DATA The 2 lead EKG demonstrated sinus rhythm. The mean heart rate was 76.8 beats per minute. Other EKG findings include: None.  LEG MOVEMENT DATA The total Periodic Limb Movements of Sleep (PLMS) were 0. The PLMS index was 0.0. A PLMS index of <15 is considered normal in adults.  IMPRESSIONS - CPAP was initiated at 7 cm and was titrated to optimal PAP pressure at 12 cm of water (AHI 0; O2 nadir 96.1%). - Mild oxygen desaturations to a nadir of 87% at 7 cm. - The patient snored with soft snoring volume during this titration study. - No cardiac abnormalities were observed during this study. - Clinically significant periodic limb movements were not noted during this study. Arousals associated with PLMs were rare.  DIAGNOSIS - Obstructive Sleep Apnea (G47.33)  RECOMMENDATIONS - Recommend an initial trial of CPAP Auto therapy at 11 - 16 cm H2O with heated humidification. A Medium size Resmed Full Face Mask AirFit F20 mask was used for the titration.  - Effort should be made to optimize nasal and oropharyngeal patency. - Avoid alcohol, sedatives and other CNS depressants that may worsen sleep apnea and disrupt normal sleep architecture. - Sleep hygiene should be reviewed to assess factors that may improve sleep quality. - Weight management and regular exercise should be initiated or continued. - Recommend a download and sleep clinic evaluation after one month of therapy. turn to Sleep Center for re-evaluation after 4 weeks of therapy   [Electronically signed] 12/28/2021 10:48 AM  Shelva Majestic MD, [facility], ABSM Diplomate, American Board of Sleep Medicine  NPI: [id number]  [place] PH: [phone]   FX: [phone] [employer]

## 2021-12-28 NOTE — Telephone Encounter (Signed)
Message left that sleep study has been completed and CPAP order has been sent tot [facility]. Call me back if he has questions and or concerns. Contact information provided.

## 2021-12-29 NOTE — Telephone Encounter (Signed)
Sleep study read, sleep coordinator tried to contact patient today, LVM

## 2022-01-01 ENCOUNTER — Telehealth: Payer: Self-pay | Admitting: Cardiovascular Disease

## 2022-01-01 NOTE — Telephone Encounter (Signed)
01/01/2022  Patient brought fmla forms to [facility] office. Pd $29 fee, completed billing, and release. Forms put in Dr.O'neal box.

## 2022-03-28 ENCOUNTER — Other Ambulatory Visit: Payer: Self-pay | Admitting: Cardiovascular Disease

## 2022-04-08 ENCOUNTER — Emergency Department (HOSPITAL_COMMUNITY)
Admission: EM | Admit: 2022-04-08 | Discharge: 2022-04-09 | Disposition: A | Discharge status: Home / Self Care | Payer: Commercial Managed Care - PPO | Attending: Emergency Medicine | Admitting: Emergency Medicine

## 2022-04-08 ENCOUNTER — Encounter (HOSPITAL_COMMUNITY): Payer: Self-pay

## 2022-04-08 ENCOUNTER — Other Ambulatory Visit: Payer: Self-pay

## 2022-04-08 DIAGNOSIS — Z7982 Long term (current) use of aspirin: Secondary | ICD-10-CM | POA: Insufficient documentation

## 2022-04-08 DIAGNOSIS — R1031 Right lower quadrant pain: Secondary | ICD-10-CM | POA: Diagnosis present

## 2022-04-08 DIAGNOSIS — R1032 Left lower quadrant pain: Secondary | ICD-10-CM | POA: Diagnosis not present

## 2022-04-08 LAB — COMPREHENSIVE METABOLIC PANEL
ALT: 36 U/L (ref 0–44)
AST: 40 U/L (ref 15–41)
Albumin: 3.7 g/dL (ref 3.5–5.0)
Alkaline Phosphatase: 86 U/L (ref 38–126)
Anion gap: 8 (ref 5–15)
BUN: 12 mg/dL (ref 6–20)
CO2: 25 mmol/L (ref 22–32)
Calcium: 8.9 mg/dL (ref 8.9–10.3)
Chloride: 105 mmol/L (ref 98–111)
Creatinine, Ser: 1.34 mg/dL — ABNORMAL HIGH (ref 0.61–1.24)
GFR, Estimated: >60 mL/min (ref >60)
Glucose, Bld: 96 mg/dL (ref 70–99)
Potassium: 4 mmol/L (ref 3.5–5.1)
Sodium: 138 mmol/L (ref 135–145)
Total Bilirubin: 0.3 mg/dL (ref 0.3–1.2)
Total Protein: 7.7 g/dL (ref 6.5–8.1)

## 2022-04-08 LAB — URINALYSIS, ROUTINE W REFLEX MICROSCOPIC
Bilirubin Urine: NEGATIVE
Glucose, UA: NEGATIVE mg/dL
Hgb urine dipstick: NEGATIVE
Ketones, ur: NEGATIVE mg/dL
Leukocytes,Ua: NEGATIVE
Nitrite: NEGATIVE
Protein, ur: NEGATIVE mg/dL
Specific Gravity, Urine: 1.021 (ref 1.005–1.030)
pH: 6 (ref 5.0–8.0)

## 2022-04-08 LAB — CBC WITH DIFFERENTIAL/PLATELET
Abs Immature Granulocytes: 0.03 10*3/uL (ref 0.00–0.07)
Basophils Absolute: 0.1 10*3/uL (ref 0.0–0.1)
Basophils Relative: 1 %
Eosinophils Absolute: 0.2 10*3/uL (ref 0.0–0.5)
Eosinophils Relative: 2 %
HCT: 45.4 % (ref 39.0–52.0)
Hemoglobin: 14.6 g/dL (ref 13.0–17.0)
Immature Granulocytes: 0 %
Lymphocytes Relative: 23 %
Lymphs Abs: 2 10*3/uL (ref 0.7–4.0)
MCH: 26.6 pg (ref 26.0–34.0)
MCHC: 32.2 g/dL (ref 30.0–36.0)
MCV: 82.8 fL (ref 80.0–100.0)
Monocytes Absolute: 1 10*3/uL (ref 0.1–1.0)
Monocytes Relative: 11 %
Neutro Abs: 5.8 10*3/uL (ref 1.7–7.7)
Neutrophils Relative %: 63 %
Platelets: 284 10*3/uL (ref 150–400)
RBC: 5.48 MIL/uL (ref 4.22–5.81)
RDW: 15.9 % — ABNORMAL HIGH (ref 11.5–15.5)
WBC: 9.1 10*3/uL (ref 4.0–10.5)
nRBC: 0 % (ref 0.0–0.2)

## 2022-04-08 LAB — LIPASE, BLOOD: Lipase: 26 U/L (ref 11–51)

## 2022-04-08 NOTE — ED Provider Triage Note (Signed)
Emergency Medicine Provider Triage Evaluation Note ? ?Waynetta Sandy , a 51 y.o. male  was evaluated in triage.  Pt complains of pain across his lower abdomen.  This has been present for 2 weeks and gradually worsening.  This initially started only left lower quadrant that has now spread.  No fevers. ? ? ?Physical Exam  ?BP (!) 163/78 (BP Location: Right Arm)   Pulse 93   Temp 98.1 ?F (36.7 ?C) (Oral)   Resp 20   Ht 5\' 3"  (1.6 m)   Wt 117.9 kg   SpO2 97%   BMI 46.06 kg/m?  ?Gen:   Awake, no distress   ?Resp:  Normal effort  ?MSK:   Moves extremities without difficulty  ?Other:  Abdomen is distended, generally tender to palpation worse in the left lower quadrant. ? ?Medical Decision Making  ?Medically screening exam initiated at 10:01 PM.  Appropriate orders placed.  was informed that the remainder of the evaluation will be completed by another provider, this initial triage assessment does not replace that evaluation, and the importance of remaining in the ED until their evaluation is complete. ? ? ?  ?Waynetta Sandy, PA-C ?04/08/22 2202 ? ?

## 2022-04-08 NOTE — ED Triage Notes (Signed)
Pt arrives POV to ED from home c/o abdominal pain and bloating that started 2 weeks ago. Pt reports pain in RLQ and LLQ and radiates to groin area. Pt states he currently "has a small hernia" in his groin area. Pt states he is having normal bowels and denies n/v. Pt's abdomen is distended and NAD. ?

## 2022-04-09 ENCOUNTER — Emergency Department (HOSPITAL_COMMUNITY): Payer: Commercial Managed Care - PPO

## 2022-04-09 MED ORDER — IOHEXOL 300 MG/ML  SOLN
100.0000 mL | Freq: Once | INTRAMUSCULAR | Status: AC | PRN
  Administered 2022-04-09: 100 mL via INTRAVENOUS

## 2022-04-09 NOTE — Discharge Instructions (Signed)
The testing did not show any serious problems.  Try taking Tylenol for pain.  You may have some bowel irregularity that will improve with taking MiraLAX once or twice a day.  Make sure you are getting plenty of rest drink a lot of fluids and taking all of your medicine as directed.  Your blood pressure was slightly elevated today.  Take your blood pressure medicine this morning.  See your doctor if not better in a few days. ?

## 2022-04-09 NOTE — ED Provider Notes (Signed)
?[facility] EMERGENCY DEPARTMENT ?Provider Note ? ? ?CSN: [id number] ?Arrival date & time: 04/08/22  2055 ? ?  ? ?History ? ?Chief Complaint  ?Patient presents with  ? Abdominal Pain  ? ? ?Reginald Gonzalez is a 51 y.o. male. ? ?HPI ?He presents for evaluation of abdominal pain and bloating for 2 weeks and concerned about constipation.  He did have a normal soft bowel movement yesterday.  The pain is mostly in the right lower quadrant but also present in the left lower quadrant.  He denies nausea, vomiting, anorexia, fever or chills.  He has not had any diarrhea. ?  ? ?Home Medications ?Prior to Admission medications   ?Medication Sig Start Date End Date Taking? Authorizing Provider  ?acetaminophen (TYLENOL) 325 MG tablet Take 650 mg by mouth every 6 (six) hours as needed for mild pain or headache.    [provider]  ?aspirin EC 81 MG tablet Take 1 tablet (81 mg total) by mouth daily. Swallow whole. 06/20/20   O'NealRonnald Ramp, MD  ?atorvastatin (LIPITOR) 40 MG tablet Take 1 tablet (40 mg total) by mouth daily. 08/24/21 11/22/21  Sande Rives, MD  ?metFORMIN (GLUCOPHAGE) 500 MG tablet Take 1 tablet (500 mg total) by mouth daily with breakfast. 08/24/21   O'Neal, Ronnald Ramp, MD  ?sildenafil (VIAGRA) 25 MG tablet Take 1 tablet (25 mg total) by mouth daily as needed for erectile dysfunction. 08/24/21   Sande Rives, MD  ?valsartan-hydrochlorothiazide (DIOVAN-HCT) 160-12.5 MG tablet TAKE 1 TABLET BY MOUTH EVERY DAY 03/29/22   O'Neal, Ronnald Ramp, MD  ?   ? ?Allergies    ?Patient has no known allergies.   ? ?Review of Systems   ?Review of Systems ? ?Physical Exam ?Updated Vital Signs ?BP (!) 153/75   Pulse 78   Temp 98.1 ?F (36.7 ?C) (Oral)   Resp 18   Ht 5\' 3"  (1.6 m)   Wt 117.9 kg   SpO2 100%   BMI 46.06 kg/m?  ?Physical Exam ?Vitals and nursing note reviewed.  ?Constitutional:   ?   General: He is not in acute distress. ?   Appearance: He is well-developed. He is  obese. He is not ill-appearing, toxic-appearing or diaphoretic.  ?HENT:  ?   Head: Normocephalic and atraumatic.  ?   Right Ear: External ear normal.  ?   Left Ear: External ear normal.  ?Eyes:  ?   Conjunctiva/sclera: Conjunctivae normal.  ?   Pupils: Pupils are equal, round, and reactive to light.  ?Neck:  ?   Trachea: Phonation normal.  ?Cardiovascular:  ?   Rate and Rhythm: Normal rate.  ?Pulmonary:  ?   Effort: Pulmonary effort is normal.  ?Chest:  ?   Chest wall: No tenderness.  ?Abdominal:  ?   General: There is no distension.  ?   Tenderness: There is no abdominal tenderness. There is no guarding.  ?Musculoskeletal:     ?   General: Normal range of motion.  ?   Cervical back: Normal range of motion and neck supple.  ?Skin: ?   General: Skin is warm and dry.  ?Neurological:  ?   Mental Status: He is alert and oriented to person, place, and time.  ?   Cranial Nerves: No cranial nerve deficit.  ?   Sensory: No sensory deficit.  ?   Motor: No abnormal muscle tone.  ?   Coordination: Coordination normal.  ?Psychiatric:     ?   Mood  and Affect: Mood normal.     ?   Behavior: Behavior normal.     ?   Thought Content: Thought content normal.     ?   Judgment: Judgment normal.  ? ? ?ED Results / Procedures / Treatments   ?Labs ?(all labs ordered are listed, but only abnormal results are displayed) ?Labs Reviewed  ?COMPREHENSIVE METABOLIC PANEL - Abnormal; Notable for the following components:  ?    Result Value  ? Creatinine, Ser 1.34 (*)   ? All other components within normal limits  ?CBC WITH DIFFERENTIAL/PLATELET - Abnormal; Notable for the following components:  ? RDW 15.9 (*)   ? All other components within normal limits  ?URINALYSIS, ROUTINE W REFLEX MICROSCOPIC  ?LIPASE, BLOOD  ? ? ?EKG ?None ? ?Radiology ?CT ABDOMEN PELVIS W CONTRAST ? ?Result Date: 04/09/2022 ?CLINICAL DATA:  Left lower quadrant pain. EXAM: CT ABDOMEN AND PELVIS WITH CONTRAST TECHNIQUE: Multidetector CT imaging of the abdomen and pelvis was  performed using the standard protocol following bolus administration of intravenous contrast. RADIATION DOSE REDUCTION: This exam was performed according to the departmental dose-optimization program which includes automated exposure control, adjustment of the mA and/or kV according to patient size and/or use of iterative reconstruction technique. CONTRAST:  100mL OMNIPAQUE IOHEXOL 300 MG/ML  SOLN COMPARISON:  None Available. FINDINGS: Lower chest: Mild atelectasis is seen within the right lung base. Hepatobiliary: There is diffuse fatty infiltration of the liver parenchyma. No focal liver abnormality is seen. No gallstones, gallbladder wall thickening, or biliary dilatation. Pancreas: Unremarkable. No pancreatic ductal dilatation or surrounding inflammatory changes. Spleen: Normal in size without focal abnormality. Adrenals/Urinary Tract: Adrenal glands are unremarkable. Kidneys are normal, without renal calculi, focal lesion, or hydronephrosis. Bladder is unremarkable. Stomach/Bowel: Stomach is within normal limits. Appendix appears normal. No evidence of bowel wall thickening, distention, or inflammatory changes. Vascular/Lymphatic: No significant vascular findings are present. No enlarged abdominal or pelvic lymph nodes. Reproductive: Prostate is unremarkable. Other: No abdominal wall hernia or abnormality. No abdominopelvic ascites. Musculoskeletal: No acute or significant osseous findings. IMPRESSION: Hepatic steatosis. Electronically Signed   By: Aram Candelahaddeus  Houston M.D.   On: 04/09/2022 00:38   ? ?Procedures ?Procedures  ? ? ?Medications Ordered in ED ?Medications  ?iohexol (OMNIPAQUE) 300 MG/ML solution 100 mL (100 mLs Intravenous Contrast Given 04/09/22 0031)  ? ? ?ED Course/ Medical Decision Making/ A&P ?  ?                        ?Medical Decision Making ?Patient with ongoing abdominal discomfort for 2 weeks without vomiting diarrhea or distinct constipation.  He is overweight, and having chronic ongoing  back pain.  He does not take chronic narcotic ? ?Problems Addressed: ?Right lower quadrant abdominal pain: undiagnosed new problem with uncertain prognosis ?   Details: Subacute problems with mild bowel irregularity; no other concerning historical factors. ? ?Amount and/or Complexity of Data Reviewed ?Independent Historian:  ?   Details: He is a cogent historian ?Labs: ordered. ?   Details: CBC, metabolic panel, lipase, urinalysis-normal except mild elevation of creatinine, which is at baseline ?Radiology: ordered and independent interpretation performed. ?   Details: CT abdomen pelvis-no acute abnormality ? ?Risk ?OTC drugs. ?Decision regarding hospitalization. ?Risk Details: Patient with nonspecific symptoms.  He has subacute discomfort in his abdomen with bowel irregularity.  Possible mild constipation versus functional bowel disorder.  No evidence for appendicitis, colitis, UTI, significant metabolic abnormality or worsening of chronic renal insufficiency.  Blood pressure mildly elevated however doubt hypertensive urgency.  Patient given a release from work for 1 day and referred back to his PCP for ongoing management.  He is instructed to use MiraLAX for a week or 2 to see if it helps him have better bowel movements and less abdominal discomfort.  There is no indication for hospitalization at this time ? ? ? ? ? ? ? ? ? ? ?Final Clinical Impression(s) / ED Diagnoses ?Final diagnoses:  ?None  ? ? ?Rx / DC Orders ?ED Discharge Orders   ? ? None  ? ?  ? ? ?  ?Mancel Bale, MD ?04/09/22 [phone] ? ?

## 2022-04-21 ENCOUNTER — Encounter (HOSPITAL_COMMUNITY): Payer: Self-pay | Admitting: *Deleted

## 2022-04-21 ENCOUNTER — Ambulatory Visit (HOSPITAL_COMMUNITY)
Admission: EM | Admit: 2022-04-21 | Discharge: 2022-04-21 | Disposition: A | Discharge status: Home / Self Care | Payer: Commercial Managed Care - PPO | Attending: Physician Assistant | Admitting: Physician Assistant

## 2022-04-21 ENCOUNTER — Other Ambulatory Visit: Payer: Self-pay

## 2022-04-21 DIAGNOSIS — M79672 Pain in left foot: Secondary | ICD-10-CM

## 2022-04-21 DIAGNOSIS — S93602A Unspecified sprain of left foot, initial encounter: Secondary | ICD-10-CM | POA: Diagnosis not present

## 2022-04-21 MED ORDER — IBUPROFEN 800 MG PO TABS: 800.0000 mg | ORAL_TABLET | Freq: Three times a day (TID) | ORAL | 1 refills | Status: AC

## 2022-04-21 NOTE — ED Provider Notes (Signed)
[place]    CSN: [id number] Arrival date & time: 04/21/22  1140      History   Chief Complaint Chief Complaint  Patient presents with   Leg Pain   Foot Pain    HPI Reginald Gonzalez is a 51 y.o. male.   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  Patient relates over the past week he has been having some increasing left foot pain and tenderness.  Patient relates the pain is on the top of the foot, and the pain is worse when he is walking, and when he tries to climb up in his truck.  Patient relates that he does not have a handle to assist getting into the truck, and has to climb up putting constant amount of pressure on his left foot.  Patient relates that he wears a normal athletic tennis shoes when he is driving his truck.  Patient relates he did take an ibuprofen this morning to help reduce the discomfort but he has not seen any benefit yet.  Patient denies any trauma to the foot, no numbness or tingling, no weakness.   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   NSTEMI (non-ST elevated myocardial infarction) ([place]) 03/09/2020   HLD (hyperlipidemia) 03/09/2020   Morbid obesity with BMI of 50.0-59.9, adult ([place]) 03/09/2020   Diabetes mellitus ([place]) 10/21/2011   HTN (hypertension) 10/21/2011    Past Surgical History:  Procedure Laterality Date   INTRAVASCULAR ULTRASOUND/IVUS N/A 03/09/2020   Procedure: Intravascular Ultrasound/IVUS;  Surgeon: Jettie Booze, MD;  Location: [place] [facility];  Service: Cardiovascular;  Laterality: N/A;   LEFT HEART CATH AND CORONARY ANGIOGRAPHY N/A 03/09/2020   Procedure: LEFT HEART CATH AND CORONARY  ANGIOGRAPHY;  Surgeon: Jettie Booze, MD;  Location: [facility] [facility];  Service: Cardiovascular;  Laterality: N/A;       Home Medications    Prior to Admission medications   Medication Sig Start Date End Date Taking? Authorizing Provider  ibuprofen (ADVIL) 800 MG tablet Take 1 tablet (800 mg total) by mouth 3 (three) times daily. 04/21/22  Yes Nyoka Lint, PA-C  acetaminophen (TYLENOL) 325 MG tablet Take 650 mg by mouth every 6 (six) hours as needed for mild pain or headache.    [provider]  aspirin EC 81 MG tablet Take 1 tablet (81 mg total) by mouth daily. Swallow whole. 06/20/20   O'NealCassie Freer, MD  atorvastatin (LIPITOR) 40 MG tablet Take 1 tablet (40 mg total) by mouth daily. 08/24/21 11/22/21  Geralynn Rile, MD  metFORMIN (GLUCOPHAGE) 500 MG tablet Take 1 tablet (500 mg total) by mouth daily with breakfast. 08/24/21   O'Neal, Cassie Freer, MD  sildenafil (VIAGRA) 25 MG tablet Take 1 tablet (25 mg total) by mouth daily as needed for erectile dysfunction. 08/24/21   Geralynn Rile, MD  valsartan-hydrochlorothiazide (DIOVAN-HCT) 160-12.5 MG tablet TAKE 1 TABLET BY MOUTH EVERY DAY 03/29/22   O'Neal, Cassie Freer, MD    Family History Family History  Problem Relation Age of Onset   Hypertension Mother    Healthy Father     Social  History Social History   Tobacco Use   Smoking status: Never   Smokeless tobacco: Never   Tobacco comments:    Used to smoke Black [employer] Milds  Vaping Use   Vaping Use: Never used  Substance Use Topics   Alcohol use: Yes    Comment: occasional   Drug use: No     Allergies   Patient has no known allergies.   Review of Systems Review of Systems  Musculoskeletal:  Positive for joint swelling (left foot, mild swelling and pain on top of foot.).    Physical Exam Triage Vital Signs ED Triage Vitals  Enc Vitals Group     BP 04/21/22 1227 133/90     Pulse Rate 04/21/22 1227 93     Resp 04/21/22 1227 18      Temp 04/21/22 1227 98.9 F (37.2 C)     Temp src --      SpO2 04/21/22 1227 96 %     Weight --      Height --      Head Circumference --      Peak Flow --      Pain Score 04/21/22 1225 10     Pain Loc --      Pain Edu? --      Excl. in [place]? --    No data found.  Updated Vital Signs BP 133/90   Pulse 93   Temp 98.9 F (37.2 C)   Resp 18   SpO2 96%   Visual Acuity Right Eye Distance:   Left Eye Distance:   Bilateral Distance:    Right Eye Near:   Left Eye Near:    Bilateral Near:     Physical Exam Constitutional:      Appearance: Normal appearance.  Musculoskeletal:       Feet:  Feet:     Comments: Left foot: Full range of motion is normal, pain is palpated along the mid dorsum of the foot, minimal swelling, no redness.  Dorsalis pedis posterior tibial pulses are normal, negative Homans' sign. There is no pain on palpation of the lower part of the leg anterior or posteriorly.  No pain is palpated along the calf. Neurological:     Mental Status: He is alert.     UC Treatments / Results  Labs (all labs ordered are listed, but only abnormal results are displayed) Labs Reviewed - No data to display  EKG   Radiology No results found.  Procedures Procedures (including critical care time)  Medications Ordered in UC Medications - No data to display  Initial Impression / Assessment and Plan / UC Course  I have reviewed the triage vital signs and the nursing notes.  Pertinent labs & imaging results that were available during my care of the patient were reviewed by me and considered in my medical decision making (see chart for details).    Plan: 1.  Advised to take ibuprofen 800 mg 1 every 8 hours with food to help decrease the pain 2.  Advised frequent Epsom salt soaks. 3.  Advised ice therapy to help reduce the swelling and pain, 10 minutes on 20 minutes off, 4-5 times throughout the day. 4.  Advised to get good soled thickened shoes to help take the  pressure off the feet. 5.  Advised to follow-up with PCP or return to urgent care if symptoms fail to improve Final Clinical Impressions(s) / UC Diagnoses   Final diagnoses:  Sprain of left foot, initial encounter  Left foot pain     Discharge Instructions      Advised to use ibuprofen 800 mg 1 every 8 hours with food to help reduce the pain and discomfort. To use ice therapy, 10 minutes on 20 minutes off, 4-5 times throughout the day over the next several days. Vies use Epsom salt soaks frequently throughout the day over the next several days to help decrease the pain and soreness. Advised to get good soles for the shoes for rock climbing in and out of the truck.    ED Prescriptions     Medication Sig Dispense Auth. Provider   ibuprofen (ADVIL) 800 MG tablet Take 1 tablet (800 mg total) by mouth 3 (three) times daily. 21 tablet Nyoka Lint, PA-C      PDMP not reviewed this encounter.   Reginald Gonzalez, [place], PA-C 04/21/22 1313

## 2022-04-21 NOTE — ED Triage Notes (Signed)
Reports Lt foot and leg pain since Thursday. 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

## 2022-04-21 NOTE — Discharge Instructions (Addendum)
Advised to use ibuprofen 800 mg 1 every 8 hours with food to help reduce the pain and discomfort. To use ice therapy, 10 minutes on 20 minutes off, 4-5 times throughout the day over the next several days. Vies use Epsom salt soaks frequently throughout the day over the next several days to help decrease the pain and soreness. Advised to get good soles for the shoes for rock climbing in and out of the truck.

## 2022-07-17 ENCOUNTER — Telehealth: Payer: Self-pay | Admitting: *Deleted

## 2022-07-17 NOTE — Telephone Encounter (Signed)
Patient called in to inquire about his CPAP machine. I told him the order was submitted to [facility] back in February. I would call and check on it. Per sharon she does not have the order. She asked for me to resubmit it to her and she will process it ASAP. Patient informed he may need another face to face visit. It has been almost 1 year since the initial appointment. Patient voiced his understanding. [facility] to [facility].

## 2022-07-23 ENCOUNTER — Ambulatory Visit: Payer: Commercial Managed Care - PPO | Attending: Nurse Practitioner | Admitting: Nurse Practitioner

## 2022-07-23 ENCOUNTER — Encounter: Payer: Self-pay | Admitting: Nurse Practitioner

## 2022-07-23 ENCOUNTER — Other Ambulatory Visit: Payer: Self-pay

## 2022-07-23 VITALS — BP 134/78 | HR 83 | Ht 63.0 in | Wt 294.6 lb

## 2022-07-23 DIAGNOSIS — I2510 Atherosclerotic heart disease of native coronary artery without angina pectoris: Secondary | ICD-10-CM

## 2022-07-23 DIAGNOSIS — I422 Other hypertrophic cardiomyopathy: Secondary | ICD-10-CM

## 2022-07-23 DIAGNOSIS — I10 Essential (primary) hypertension: Secondary | ICD-10-CM

## 2022-07-23 DIAGNOSIS — N529 Male erectile dysfunction, unspecified: Secondary | ICD-10-CM

## 2022-07-23 DIAGNOSIS — E119 Type 2 diabetes mellitus without complications: Secondary | ICD-10-CM

## 2022-07-23 DIAGNOSIS — G4733 Obstructive sleep apnea (adult) (pediatric): Secondary | ICD-10-CM

## 2022-07-23 MED ORDER — ATORVASTATIN CALCIUM 40 MG PO TABS
40.0000 mg | ORAL_TABLET | Freq: Every day | ORAL | 3 refills | Status: DC
Start: 2022-07-23 — End: 2023-02-15

## 2022-07-23 NOTE — Addendum Note (Signed)
Addended by: Lamar Benes on: 07/23/2022 02:37 PM   Modules accepted: Orders

## 2022-07-23 NOTE — Patient Instructions (Addendum)
Medication Instructions:  Your physician recommends that you continue on your current medications as directed. Please refer to the Current Medication list given to you today.   *If you need a refill on your cardiac medications before your next appointment, please call your pharmacy*   Lab Work: Your physician recommends that you return for lab work in 6-8 weeks. Fasting lipid panel LFTs   If you have labs (blood work) drawn today and your tests are completely normal, you will receive your results only by: MyChart Message (if you have MyChart) OR A paper copy in the mail If you have any lab test that is abnormal or we need to change your treatment, we will call you to review the results.   Testing/Procedures: NONE ordered at this time of appointment     Follow-Up: At [facility], you and your health needs are our priority.  As part of our continuing mission to provide you with exceptional heart care, we have created designated Provider Care Teams.  These Care Teams include your primary Cardiologist (physician) and Advanced Practice Providers (APPs -  Physician Assistants and Nurse Practitioners) who all work together to provide you with the care you need, when you need it.  We recommend signing up for the patient portal called "MyChart".  Sign up information is provided on this After Visit Summary.  MyChart is used to connect with patients for Virtual Visits (Telemedicine).  Patients are able to view lab/test results, encounter notes, upcoming appointments, etc.  Non-urgent messages can be sent to your provider as well.   To learn more about what you can do with MyChart, go to [email or web address].    Your next appointment:   6 month(s)  The format for your next appointment:   In Person  Provider:   Reatha Harps, MD     Other Instructions Referral sent to Dietician.   Important Information About Sugar

## 2022-07-23 NOTE — Progress Notes (Signed)
Office Visit    Patient Name: Reginald Gonzalez Date of Encounter: 07/23/2022  Primary Care Provider:  Mirna Mires, MD Primary Cardiologist:  Reatha Harps, MD  Chief Complaint    51 year old male with a history of CAD, apical variant HCM, hypertension, OSA, and type 2 diabetes who presents for follow-up related to CAD and OSA.  Past Medical History    Past Medical History:  Diagnosis Date   Diabetes mellitus    Hypertension    Past Surgical History:  Procedure Laterality Date   INTRAVASCULAR ULTRASOUND/IVUS N/A 03/09/2020   Procedure: Intravascular Ultrasound/IVUS;  Surgeon: Corky Crafts, MD;  Location: [facility];  Service: Cardiovascular;  Laterality: N/A;   LEFT HEART CATH AND CORONARY ANGIOGRAPHY N/A 03/09/2020   Procedure: LEFT HEART CATH AND CORONARY ANGIOGRAPHY;  Surgeon: Corky Crafts, MD;  Location[facility];  Service: Cardiovascular;  Laterality: N/A;    Allergies  No Known Allergies  History of Present Illness    51 year old male with the above past medical history including CAD, apical variant HCM, hypertension, OSA, and type 2 diabetes.   He was hospitalized in 02/2020 in the setting of NSTEMI.  Catheterization revealed nonobstructive CAD (oRCA 40%, mLCx 40%, catheter induced vasospasm). Echocardiogram at the time showed EF 60 to 65%, no RWMA, G2DD, RV systolic function, no valvular abnormalities. Follow-up cardiac MRI to rule out myocarditis showed apical variant hypertrophic cardiomyopathy. No family history of SCD.  3-day ZIO monitor showed no evidence of ventricular tachycardia.  He was last seen in the office on 08/24/2021 and was stable from a cardiac standpoint.  He reported concerns for erectile dysfunction, snoring, and fatigue.  Started on sildenafil.  Home sleep study was positive for sleep apnea.  CPAP machine was ordered, however, patient never received it.  Repeat echocardiogram in January 2023 showed hyperdynamic LV  function, EF >75%, no RWMA, moderate to severe LVH, RV systolic function, no significant valvular abnormalities.   He presents today for follow-up. Since his last visit he has been stable from a cardiac standpoint.  He notes stable mild dyspnea on exertion, generalized fatigue, occasional fleeting chest discomfort at rest.  He denies any exertional symptoms.  He denies palpitations, dizziness, presyncope, syncope, edema, PND, orthopnea, weight gain.  He is eager to receive his CPAP machine.  He ran out of his prescription for his atorvastatin and has not taken it for several months.  He is requesting a refill today.  He notes that sildenafil was prescribed for him at his last visit, however, he does not think that this is working.  He has ongoing concerns for ED.  Otherwise, he reports feeling well denies any additional concerns today.  Home Medications    Current Outpatient Medications  Medication Sig Dispense Refill   acetaminophen (TYLENOL) 325 MG tablet Take 650 mg by mouth every 6 (six) hours as needed for mild pain or headache.     aspirin EC 81 MG tablet Take 1 tablet (81 mg total) by mouth daily. Swallow whole. 90 tablet 3   ibuprofen (ADVIL) 800 MG tablet Take 1 tablet (800 mg total) by mouth 3 (three) times daily. 21 tablet 1   metFORMIN (GLUCOPHAGE) 500 MG tablet Take 1 tablet (500 mg total) by mouth daily with breakfast. 90 tablet 1   sildenafil (VIAGRA) 25 MG tablet Take 1 tablet (25 mg total) by mouth daily as needed for erectile dysfunction. 15 tablet 0   valsartan-hydrochlorothiazide (DIOVAN-HCT) 160-12.5 MG tablet TAKE  1 TABLET BY MOUTH EVERY DAY 90 tablet 2   atorvastatin (LIPITOR) 40 MG tablet Take 1 tablet (40 mg total) by mouth daily. 90 tablet 3   No current facility-administered medications for this visit.     Review of Systems    He denies chest pain, palpitations, pnd, orthopnea, n, v, dizziness, syncope, edema, weight gain, or early satiety. All other systems reviewed  and are otherwise negative except as noted above.   Physical Exam    VS:  BP 134/78   Pulse 83   Ht 5\' 3"  (1.6 m)   Wt 294 lb 9.6 oz (133.6 kg)   SpO2 98%   BMI 52.19 kg/m  GEN: Well nourished, well developed, in no acute distress. HEENT: normal. Neck: Supple, no JVD, carotid bruits, or masses. Cardiac: RRR, no murmurs, rubs, or gallops. No clubbing, cyanosis, edema.  Radials/DP/PT 2+ and equal bilaterally.  Respiratory:  Respirations regular and unlabored, clear to auscultation bilaterally. GI: Obese, soft, nontender, nondistended, BS + x 4. MS: no deformity or atrophy. Skin: warm and dry, no rash. Neuro:  Strength and sensation are intact. Psych: Normal affect.  Accessory Clinical Findings    ECG personally reviewed by me today -sinus rhythm, 83 bpm, nonspecific ST/T wave changes- no acute changes.   Lab Results  Component Value Date   WBC 9.1 04/08/2022   HGB 14.6 04/08/2022   HCT 45.4 04/08/2022   MCV 82.8 04/08/2022   PLT 284 04/08/2022   Lab Results  Component Value Date   CREATININE 1.34 (H) 04/08/2022   BUN 12 04/08/2022   NA 138 04/08/2022   K 4.0 04/08/2022   CL 105 04/08/2022   CO2 25 04/08/2022   Lab Results  Component Value Date   ALT 36 04/08/2022   AST 40 04/08/2022   ALKPHOS 86 04/08/2022   BILITOT 0.3 04/08/2022   Lab Results  Component Value Date   CHOL 201 (H) 07/25/2020   HDL 39 (L) 07/25/2020   LDLCALC 135 (H) 07/25/2020   TRIG 151 (H) 07/25/2020   CHOLHDL 5.2 (H) 07/25/2020    Lab Results  Component Value Date   HGBA1C 6.4 (H) 03/09/2020    Assessment & Plan    1. Apical variant HCM:  MRI in 02/2020 showed apical variant hypertrophic cardiomyopathy. No family history of SCD. 3-day ZIO monitor in 03/2020 showed no evidence of ventricular tachycardia. Most recent echo in January 2023 showed hyperdynamic LV function, EF >75%, no RWMA, moderate to severe LVH, RV systolic function, no significant valvular abnormalities.  Euvolemic and  well compensated on exam. Discussed emergency precautions.    2. CAD:  S/p NSTEMI in 02/2020. Cath revealed nonobstructive CAD (oRCA 40%, mLCx 40%, catheter induced vasospasm).  Notes occasional chest discomfort at rest, denies exertional symptoms, symptoms unchanged since the time of his cath.  Continue to monitor.  Continue aspirin, valsartan-HCTZ, and Lipitor.  3. Hypertension: BP well controlled. Continue current antihypertensive regimen.   4. Hyperlipidemia: LDL was 109 in 08/2021.  He states he ran out of his prescription for atorvastatin and has not been taking it recently.  Resume atorvastatin, continue ASA.  Will repeat lipids, LFTs in 6-8 weeks following resumption of atorvastatin.   5. OSA: Has not received his CPAP.  He does report ongoing fatigue, daytime sleepiness.  Epworth scale of 7.  He is eager to receive his CPAP-sleep clinic team following (Dr. 09/2021).    6. ED: Has tried Cialis, and Viagra. Would recommend follow-up with PCP and  possible referral to urology if this continues to be a problem.   7. Type 2 diabetes/obesity: A1C was 6.2 in 08/2021. Monitored and managed per PCP.  Will refer to dietitian per patient request.  Encouraged ongoing lifestyle modifications with diet and exercise.  8. Disposition: Follow-up in 6 months with Dr. Flora Lipps.     Joylene Grapes, NP 07/23/2022, 2:30 PM

## 2022-08-13 ENCOUNTER — Encounter: Payer: Self-pay | Admitting: Dietician

## 2022-08-13 ENCOUNTER — Encounter: Payer: Commercial Managed Care - PPO | Attending: Family Medicine | Admitting: Dietician

## 2022-08-13 DIAGNOSIS — E119 Type 2 diabetes mellitus without complications: Secondary | ICD-10-CM | POA: Diagnosis present

## 2022-08-13 NOTE — Patient Instructions (Addendum)
Breakfast Ideas: -Oatmeal with boiled eggs on the side -Oatmeal with peanut butter and fruit -Choose muffin over biscuit when buying breakfast sandwiches  Balance out your snacks with a carb and protein -applesauce and peanuts -tuna and crackers -veggies and dip -apple and peanut butter -banana and peanut butter -orange and nuts  Lunch/Dinner Ideas -whole wheat/carb balance tortilla with peanut butter and banana -whole wheat/carb balance tortilla with peanut butter and sugar free jelly -Tuna salad with crackers and fruit or carrot sticks with [employer] produce to keep on hand: -mini cucumbers -carrot sticks -fruits (oranges, bananas, apple, strawberries)  Limit [place] drinks or choose a diet one.   Aim for 150 minutes of physical activity weekly.  -15-30 minute walk before or after work.

## 2022-08-13 NOTE — Progress Notes (Signed)
Diabetes Self-Management Education  Visit Type: First/Initial  Appt. Start Time: 0800 Appt. End Time: 0905  08/13/2022  [name], identified by name and date of birth, is a 51 y.o. male with a diagnosis of Diabetes: Type 2.   ASSESSMENT  Primary concern: pt states he has been on and off with taking his metformin and states he has read things on the internet that made him want to quit taking it but he recently has been more consistent with medication. 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 Pt also states he is interested in weight loss.   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 Pt's dad lives with him and pt does shopping and cooking when home.  Pt has small fridge in truck with capability of keeping a few fresh fruits and vegetables on hand.   Pt states he recently joined a gym but has only gone twice. He states his only exercise right now is ADLs.  Height 5\' 4"  (1.626 m), weight 297 lb 1.6 oz (134.8 kg). Body mass index is 51 kg/m.   Diabetes Self-Management Education - 08/13/22 0804       Visit Information   Visit Type First/Initial      Initial Visit   Diabetes Type Type 2    Are you currently following a meal plan? No    Are you taking your medications as prescribed? Yes      Health Coping   How would you rate your overall health? Good      Psychosocial Assessment   Patient Belief/Attitude about Diabetes Defeat/Burnout    What is the hardest part about your diabetes right now, causing you the most concern, or is the most worrisome to you about your diabetes?   Other (comment)   pts primary concern is getting his weight down   Self-care barriers None    Self-management support Doctor's office    Other persons present  Patient    Patient Concerns Healthy Lifestyle    Special Needs None    Preferred Learning Style No preference indicated    Learning Readiness Ready    How often do you need to have someone help you when you read instructions, pamphlets, or other written materials from your doctor or pharmacy? 1 - Never    What is the last grade level you completed in school? 12th      Pre-Education Assessment   Patient understands the diabetes disease and treatment process. Needs Instruction    Patient understands incorporating nutritional management into lifestyle. Needs Instruction    Patient undertands incorporating physical activity into lifestyle. Needs Instruction    Patient understands using medications safely. Needs Instruction    Patient understands monitoring blood glucose, interpreting and using results Needs Instruction    Patient understands prevention, detection, and treatment of acute complications. Needs Instruction    Patient understands prevention, detection, and treatment of chronic complications. Needs Instruction    Patient understands how to develop strategies to address psychosocial issues. Needs Instruction    Patient understands how to develop strategies to promote health/change behavior. Needs Instruction      Complications   How often do you check your blood sugar? Not recommended by provider    Have you had  a dilated eye exam in the past 12 months? Yes    Have you had a dental exam in the past 12 months? Yes    Are you checking your feet? Yes    How many days per week are you checking your feet? 7      Dietary Intake   Breakfast sausage egg and cheese OR oatmeal plain from truck stop    Snack (morning) none OR m&ms OR honeybun OR banana    Lunch burger OR noodle cup OR salad with chicken    Snack (afternoon) none    Dinner bologna sandwich with chips    Snack (evening) crackers    Beverage(s) coffee with french vanilla creamer, water, occasional soda, 1 [place] juice a day       Activity / Exercise   Activity / Exercise Type ADL's    How many days per week do you exercise? 0    How many minutes per day do you exercise? 0    Total minutes per week of exercise 0      Patient Education   Previous Diabetes Education No    Disease Pathophysiology Definition of diabetes, type 1 and 2, and the diagnosis of diabetes;Factors that contribute to the development of diabetes;Explored patient's options for treatment of their diabetes    Healthy Eating Role of diet in the treatment of diabetes and the relationship between the three main macronutrients and blood glucose level;Plate Method;Reviewed blood glucose goals for pre and post meals and how to evaluate the patients' food intake on their blood glucose level.;Meal timing in regards to the patients' current diabetes medication.;Information on hints to eating out and maintain blood glucose control.;Meal options for control of blood glucose level and chronic complications.    Being Active Role of exercise on diabetes management, blood pressure control and cardiac health.;Helped patient identify appropriate exercises in relation to his/her diabetes, diabetes complications and other health issue.    Medications Reviewed patients medication for diabetes, action, purpose, timing of dose and side effects.;Reviewed medication adjustment guidelines for hyperglycemia and sick days.    Monitoring Identified appropriate SMBG and/or A1C goals.;Daily foot exams;Yearly dilated eye exam    Acute complications Discussed and identified patients' prevention, symptoms, and treatment of hyperglycemia.;Taught prevention, symptoms, and  treatment of hypoglycemia - the 15 rule.    Chronic complications Relationship between chronic complications and blood glucose control;Dental care;Assessed and discussed foot care and prevention of foot problems;Lipid levels, blood glucose control and heart disease;Identified and discussed with patient  current chronic  complications;Retinopathy and reason for yearly dilated eye exams;Nephropathy, what it is, prevention of, the use of ACE, ARB's and early detection of through urine microalbumia.;Reviewed with patient heart disease, higher risk of, and prevention    Diabetes Stress and Support Identified and addressed patients feelings and concerns about diabetes;Worked with patient to identify barriers to care and solutions;Role of stress on diabetes;Brainstormed with patient on coping mechanisms for social situations, getting support from significant others, dealing with feelings about diabetes;Travel strategies    Lifestyle and Health Coping Lifestyle issues that need to be addressed for better diabetes care      Individualized Goals (developed by patient)   Nutrition General guidelines for healthy choices and portions discussed    Physical Activity Exercise 5-7 days per week;30 minutes per day    Medications take my medication as prescribed    Monitoring  Not Applicable    Problem Solving Eating Pattern;Medication consistency    Reducing Risk do foot  checks daily    Health Coping Ask for help with psychological, social, or emotional issues      Post-Education Assessment   Patient understands the diabetes disease and treatment process. Comprehends key points    Patient understands incorporating nutritional management into lifestyle. Comprehends key points    Patient undertands incorporating physical activity into lifestyle. Comprehends key points    Patient understands using medications safely. Comphrehends key points    Patient understands monitoring blood glucose, interpreting and using results Comprehends key points    Patient understands prevention, detection, and treatment of acute complications. Comprehends key points    Patient understands prevention, detection, and treatment of chronic complications. Comprehends key points    Patient understands how to develop strategies to address psychosocial  issues. Comprehends key points    Patient understands how to develop strategies to promote health/change behavior. Comprehends key points      Outcomes   Expected Outcomes Demonstrated interest in learning. Expect positive outcomes    Future DMSE PRN    Program Status Completed             Individualized Plan for Diabetes Self-Management Training:   Learning Objective:  Patient will have a greater understanding of diabetes self-management. Patient education plan is to attend individual and/or group sessions per assessed needs and concerns.   Plan:   Patient Instructions  Breakfast Ideas: -Oatmeal with boiled eggs on the side -Oatmeal with peanut butter and fruit -Choose muffin over biscuit when buying breakfast sandwiches  Balance out your snacks with a carb and protein -applesauce and peanuts -tuna and crackers -veggies and dip -apple and peanut butter -banana and peanut butter -orange and nuts  Lunch/Dinner Ideas -whole wheat/carb balance tortilla with peanut butter and banana -whole wheat/carb balance tortilla with peanut butter and sugar free jelly -Tuna salad with crackers and fruit or carrot sticks with [employer] produce to keep on hand: -mini cucumbers -carrot sticks -fruits (oranges, bananas, apple, strawberries)  Limit [place] drinks or choose a diet one.   Aim for 150 minutes of physical activity weekly.  -15-30 minute walk before or after work.    Expected Outcomes:  Demonstrated interest in learning. Expect positive outcomes  Education material provided: ADA - How to Thrive: A Guide for Your Journey with Diabetes, A1C conversion sheet, My Plate, and Snack sheet  If problems or questions, patient to contact team via:  Phone  Future DSME appointment: PRN

## 2022-09-04 ENCOUNTER — Telehealth: Payer: Self-pay | Admitting: *Deleted

## 2022-09-04 NOTE — Telephone Encounter (Signed)
Received a call from [facility] with [facility] informing me that when she called to set the patient up for CPAP his insurance has changed to [place]. They do not take [employer] therefore she will forward his order to [place].

## 2022-10-09 ENCOUNTER — Telehealth: Payer: Self-pay | Admitting: *Deleted

## 2022-10-09 NOTE — Telephone Encounter (Signed)
Returned a call to the patient. He informs me that he still does not have his CPAP machine. He states that he contacted [facility] and was given the number to [facility] by Sunday Shams. I told him that he cannot go to [facility] due to his insurance. Patient requests for information to be sent to [facility], which is where I was previously told that it would be sent. Jasmine December was contacted by me again today informing her that he has requested to go to [facility]. She states that she will send over the order today.

## 2022-10-20 ENCOUNTER — Other Ambulatory Visit: Payer: Self-pay

## 2022-10-20 ENCOUNTER — Emergency Department (HOSPITAL_COMMUNITY): Payer: Commercial Managed Care - PPO

## 2022-10-20 ENCOUNTER — Emergency Department (HOSPITAL_COMMUNITY)
Admission: EM | Admit: 2022-10-20 | Discharge: 2022-10-21 | Disposition: A | Discharge status: Home / Self Care | Payer: Commercial Managed Care - PPO | Attending: Emergency Medicine | Admitting: Emergency Medicine

## 2022-10-20 DIAGNOSIS — I10 Essential (primary) hypertension: Secondary | ICD-10-CM | POA: Insufficient documentation

## 2022-10-20 DIAGNOSIS — Z7982 Long term (current) use of aspirin: Secondary | ICD-10-CM | POA: Diagnosis not present

## 2022-10-20 DIAGNOSIS — I2510 Atherosclerotic heart disease of native coronary artery without angina pectoris: Secondary | ICD-10-CM | POA: Insufficient documentation

## 2022-10-20 DIAGNOSIS — E119 Type 2 diabetes mellitus without complications: Secondary | ICD-10-CM | POA: Diagnosis not present

## 2022-10-20 DIAGNOSIS — R5383 Other fatigue: Secondary | ICD-10-CM | POA: Insufficient documentation

## 2022-10-20 DIAGNOSIS — R079 Chest pain, unspecified: Secondary | ICD-10-CM | POA: Diagnosis present

## 2022-10-20 DIAGNOSIS — Z79899 Other long term (current) drug therapy: Secondary | ICD-10-CM | POA: Insufficient documentation

## 2022-10-20 DIAGNOSIS — Z7984 Long term (current) use of oral hypoglycemic drugs: Secondary | ICD-10-CM | POA: Diagnosis not present

## 2022-10-20 LAB — BASIC METABOLIC PANEL
Anion gap: 13 (ref 5–15)
BUN: 9 mg/dL (ref 6–20)
CO2: 25 mmol/L (ref 22–32)
Calcium: 9.6 mg/dL (ref 8.9–10.3)
Chloride: 101 mmol/L (ref 98–111)
Creatinine, Ser: 1.21 mg/dL (ref 0.61–1.24)
GFR, Estimated: >60 mL/min (ref >60)
Glucose, Bld: 89 mg/dL (ref 70–99)
Potassium: 4 mmol/L (ref 3.5–5.1)
Sodium: 139 mmol/L (ref 135–145)

## 2022-10-20 LAB — CBC
HCT: 44.6 % (ref 39.0–52.0)
Hemoglobin: 14.4 g/dL (ref 13.0–17.0)
MCH: 26.7 pg (ref 26.0–34.0)
MCHC: 32.3 g/dL (ref 30.0–36.0)
MCV: 82.7 fL (ref 80.0–100.0)
Platelets: 262 K/uL (ref 150–400)
RBC: 5.39 MIL/uL (ref 4.22–5.81)
RDW: 15.4 % (ref 11.5–15.5)
WBC: 7.8 K/uL (ref 4.0–10.5)
nRBC: 0 % (ref 0.0–0.2)

## 2022-10-20 LAB — TROPONIN I (HIGH SENSITIVITY)
Troponin I (High Sensitivity): 30 ng/L — ABNORMAL HIGH (ref <18)
Troponin I (High Sensitivity): 29 ng/L — ABNORMAL HIGH

## 2022-10-20 NOTE — ED Triage Notes (Signed)
Patient reports intermittent mid/left chest pain for several weeks radiating to left arm with mild SOB , denies emesis nor diaphoresis .

## 2022-10-20 NOTE — ED Provider Triage Note (Signed)
Emergency Medicine Provider Triage Evaluation Note  BELEN ZWAHLEN , a 51 y.o. male  was evaluated in triage.  Pt complains of L upper arm pain that radiates to his chest for 3 weeks. Chest pain comes and goes by the second. L arm hurts constantly. No aggravating or alleviating factors. Had SOB earlier today. Pt is a truck driver  Review of Systems  Positive: CP, left arm pain, SOB Negative:   Physical Exam  BP (!) 141/99 (BP Location: Right Arm)   Pulse 86   Temp 99.5 F (37.5 C) (Oral)   Resp 18   SpO2 100%  Gen:   Awake, no distress   Resp:  Normal effort  MSK:   Moves extremities without difficulty  Other:    Medical Decision Making  Medically screening exam initiated at 8:54 PM.  Appropriate orders placed.  Waynetta Sandy was informed that the remainder of the evaluation will be completed by another provider, this initial triage assessment does not replace that evaluation, and the importance of remaining in the ED until their evaluation is complete.  Workup initiated   Su Monks, PA-C 10/20/22 2055

## 2022-10-21 MED ORDER — METOPROLOL SUCCINATE ER 25 MG PO TB24: 25.0000 mg | ORAL_TABLET | Freq: Every day | ORAL | 11 refills | Status: DC

## 2022-10-21 NOTE — ED Provider Notes (Addendum)
[facility] EMERGENCY DEPARTMENT Provider Note   CSN: [id number] Arrival date & time: 10/20/22  2019     History  Chief Complaint  Patient presents with   Chest Pain    Reginald Gonzalez is a 51 y.o. male.  Pt complains of pain in his left chest on and off.  Pt reports no increase with activity.  Pt reports he has had a cardiac cath in the past.  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  Patient is followed by cardiology.  The history is provided by the patient. No language interpreter was used.  Chest Pain Pain location:  L chest Pain quality: aching   Pain radiates to:  Does not radiate Pain severity:  Moderate Onset quality:  Gradual Duration:  2 weeks Timing:  Constant Progression:  Worsening Chronicity:  New Relieved by:  Nothing Worsened by:  Nothing Ineffective treatments:  None tried Associated symptoms: fatigue   Associated symptoms: no abdominal pain   Risk factors: coronary artery disease, diabetes mellitus, high cholesterol and hypertension        Home Medications Prior to Admission medications   Medication Sig Start Date End Date Taking? Authorizing Provider  acetaminophen (TYLENOL) 325 MG tablet Take 650 mg by mouth every 6 (six) hours as needed for mild pain or headache.   Yes [provider]  aspirin EC 81 MG tablet Take 1 tablet (81 mg total) by mouth daily. Swallow whole. 06/20/20  Yes O'Neal, Ronnald Ramp, MD  atorvastatin (LIPITOR) 40 MG tablet Take 1 tablet (40 mg total) by mouth daily. 07/23/22  Yes Monge, Petra Kuba, NP  metoprolol succinate (TOPROL XL) 25 MG 24 hr tablet Take 1 tablet (25 mg total) by mouth daily. 10/21/22 10/21/23 Yes Cheron Schaumann K, PA-C  valsartan-hydrochlorothiazide (DIOVAN-HCT) 160-12.5 MG tablet TAKE 1 TABLET BY MOUTH EVERY DAY Patient taking differently: Take 1 tablet by mouth daily. 03/29/22  Yes O'Neal, Ronnald Ramp, MD  ibuprofen (ADVIL) 800 MG tablet Take 1  tablet (800 mg total) by mouth 3 (three) times daily. Patient not taking: Reported on 10/21/2022 04/21/22   Ellsworth Lennox, PA-C  metFORMIN (GLUCOPHAGE) 500 MG tablet Take 1 tablet (500 mg total) by mouth daily with breakfast. Patient not taking: Reported on 10/21/2022 08/24/21   Sande Rives, MD      Allergies    Patient has no known allergies.    Review of Systems   Review of Systems  Constitutional:  Positive for fatigue.  Cardiovascular:  Positive for chest pain.  Gastrointestinal:  Negative for abdominal pain.  All other systems reviewed and are negative.   Physical Exam Updated Vital Signs BP 130/80   Pulse 89   Temp 97.8 F (36.6 C)   Resp 18   SpO2 95%  Physical Exam Vitals and nursing note reviewed.  Constitutional:      Appearance: He is well-developed.  HENT:     Head: Normocephalic.  Cardiovascular:     Rate and Rhythm: Normal rate and regular rhythm.     Heart sounds: Normal heart sounds.  Pulmonary:     Effort: Pulmonary effort is normal.  Abdominal:     General: There is no distension.  Musculoskeletal:        General: Normal range of motion.     Cervical back: Normal range of motion.  Skin:    General: Skin is warm.  Neurological:     Mental Status: He is alert and  oriented to person, place, and time.  Psychiatric:        Mood and Affect: Mood normal.     ED Results / Procedures / Treatments   Labs (all labs ordered are listed, but only abnormal results are displayed) Labs Reviewed  TROPONIN I (HIGH SENSITIVITY) - Abnormal; Notable for the following components:      Result Value   Troponin I (High Sensitivity) 30 (*)    All other components within normal limits  TROPONIN I (HIGH SENSITIVITY) - Abnormal; Notable for the following components:   Troponin I (High Sensitivity) 29 (*)    All other components within normal limits  BASIC METABOLIC PANEL  CBC    EKG EKG Interpretation  Date/Time:  Saturday October 20 2022 20:40:28  EST Ventricular Rate:  82 PR Interval:  164 QRS Duration: 78 QT Interval:  382 QTC Calculation: 446 R Axis:   12 Text Interpretation: Normal sinus rhythm Possible Anterior infarct , age undetermined ST & T wave abnormality, consider lateral ischemia Abnormal ECG No significant change since last tracing Confirmed by Melene Plan [phone]) on 10/21/2022 7:30:56 AM  Radiology DG Chest 2 View  Result Date: 10/20/2022 CLINICAL DATA:  Chest pain and shortness of breath. EXAM: CHEST - 2 VIEW COMPARISON:  March 08, 2020 FINDINGS: The heart size and mediastinal contours are within normal limits. Both lungs are clear. The visualized skeletal structures are unremarkable. IMPRESSION: No active cardiopulmonary disease. Electronically Signed   By: Aram Candela M.D.   On: 10/20/2022 21:20    Procedures Procedures    Medications Ordered in ED Medications - No data to display  ED Course/ Medical Decision Making/ A&P                           Medical Decision Making Patient complains of chest pain on and off for the past 2 weeks patient is not currently having any chest pain  Amount and/or Complexity of Data Reviewed External Data Reviewed: notes.    Details: Urology notes are reviewed Labs: ordered. Decision-making details documented in ED Course.    Details: Labs ordered reviewed and interpreted patient has a troponin of 29 and 30 Radiology: ordered and independent interpretation performed. Decision-making details documented in ED Course.    Details: X-ray shows no acute cardiopulmonary disease ECG/medicine tests: ordered and independent interpretation performed. Decision-making details documented in ED Course.    Details: EKG shows no acute changes Discussion of management or test interpretation with external provider(s): Discussed the patient and his evaluation with Dr. Diona Browner neurology on call he advised starting the patient on metoprolol XL 12.5 mg and having the patient follow-up in the  office for recheck.  Risk Prescription drug management.           Final Clinical Impression(s) / ED Diagnoses Final diagnoses:  Nonspecific chest pain    Rx / DC Orders ED Discharge Orders          Ordered    metoprolol succinate (TOPROL XL) 25 MG 24 hr tablet  Daily        10/21/22 0800          And is counseled on findings from his evaluation he is advised of follow-up plan he is currently pain-free and is stable for discharge. An After Visit Summary was printed and given to the patient.     Elson Areas, PA-C 10/21/22 1253    Elson Areas, [place] 10/21/22 1311  Melene Plan, DO 10/21/22 1416

## 2022-10-21 NOTE — Discharge Instructions (Addendum)
Take 81mg  aspirin daily.  The cardiologist advised to have you start taking a new medication.  They will call you next week with an appointment

## 2022-11-02 ENCOUNTER — Ambulatory Visit: Payer: Commercial Managed Care - PPO | Admitting: Adult Health

## 2022-11-28 ENCOUNTER — Telehealth: Payer: Self-pay | Admitting: Cardiovascular Disease

## 2022-11-28 NOTE — Telephone Encounter (Signed)
Calling bout his cpap machine.He states he has been waiting for 2 years. Please advise

## 2022-11-28 NOTE — Telephone Encounter (Signed)
Patient deferred to [place] to check the status.

## 2022-12-22 ENCOUNTER — Other Ambulatory Visit: Payer: Self-pay | Admitting: Cardiovascular Disease

## 2023-01-08 ENCOUNTER — Telehealth: Payer: Self-pay | Admitting: Cardiovascular Disease

## 2023-01-08 NOTE — Telephone Encounter (Signed)
I called the patient, he states they faxed over the paperwork that was needed. He has not had an ECHO in 1 year- which may need to be updated. I will check on the forms tomorrow when I get back to work. Just FYI.

## 2023-01-08 NOTE — Telephone Encounter (Signed)
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 He states he is in [place] now and they are requesting he have clearance sent in.

## 2023-01-08 NOTE — Telephone Encounter (Signed)
Pt stated he needs DOT clearance faxed to [facility] at [phone].

## 2023-01-09 ENCOUNTER — Encounter: Payer: Self-pay | Admitting: Cardiovascular Disease

## 2023-01-09 NOTE — Telephone Encounter (Signed)
Patient is following up. He would like to know if the paperwork has been received.

## 2023-01-09 NOTE — Telephone Encounter (Signed)
Patient is following up requesting to speak with Reginald Gonzalez regarding DOT clearance again.

## 2023-01-09 NOTE — Telephone Encounter (Signed)
Addressed by Almyra Free LPN, via MyChart

## 2023-01-09 NOTE — Telephone Encounter (Signed)
Patient is calling stating he needs a physical copy of this letter sent to [phone].

## 2023-01-10 ENCOUNTER — Encounter: Payer: Self-pay | Admitting: Cardiovascular Disease

## 2023-01-11 ENCOUNTER — Telehealth: Payer: Self-pay | Admitting: *Deleted

## 2023-01-11 NOTE — Telephone Encounter (Signed)
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  No date of birth on letter. Called office to get birth date. This has already been addressed.

## 2023-02-11 NOTE — Progress Notes (Signed)
Cardiology Office Note:   Date:  02/15/2023  NAME:  Reginald Gonzalez    MRN: [id number] DOB:  [birth year]   PCP:  Iona Beard, MD  Cardiologist:  Evalina Field, MD  Electrophysiologist:  None   Referring MD: Iona Beard, MD   Chief Complaint  Patient presents with   Follow-up    6 months.   History of Present Illness:   Reginald Gonzalez is a 52 y.o. male with a hx of apical variant HCM who presents for follow-up.  He reports he is doing well.  Denies any chest pain or trouble breathing.  No syncope.  No rapid heartbeat sensation.  All of his siblings and children have been screened for hypertrophic cardiomyopathy and they are clear.  He does have sleep apnea which is severe.  Apparently he has been unable to get his CPAP machine.  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  Will reach out to sleep medicine to see where we are with the CPAP machine.  Echo last year unchanged.  Monitor negative.  He is due for repeat monitor.  No syncope.  No family history of sudden death.  9% late gadolinium enhancement on cardiac MRI.  No evidence of apical aneurysm.  Overall from a hypertrophic cardiomyopathy standpoint he is stable.  There is no obstruction.  This is apical variant.  Blood pressure is well-controlled.  He has nonobstructive CAD.  He is on aspirin.  He is on Lipitor.  LDL could be better.  We again encouraged weight loss.  This is contributing.   Problem List 1. Apical Variant Hypertrophic CM -3 day zio negative for VT -no syncope -no FH SCD -9% LGE 2. Non-obstructive CAD/NSTEMI -02/2020: 40% RCA, 50% mid LCX -vasospasm noticed during cath 3. DM -A1c 6.6 4. HTN 5. HLD -T chol 209, HDL 47, LDL 136, TG 132 6. Obesity (BMI 53) 7. OSA -severe with hypoxia   Past Medical History: Past Medical History:  Diagnosis Date   Diabetes mellitus    Hypertension     Past Surgical History: Past Surgical History:  Procedure Laterality Date   INTRAVASCULAR  ULTRASOUND/IVUS N/A 03/09/2020   Procedure: Intravascular Ultrasound/IVUS;  Surgeon: Jettie Booze, MD;  Location: [facility];  Service: Cardiovascular;  Laterality: N/A;   LEFT HEART CATH AND CORONARY ANGIOGRAPHY N/A 03/09/2020   Procedure: LEFT HEART CATH AND CORONARY ANGIOGRAPHY;  Surgeon: Jettie Booze, MD;  Location: [facility];  Service: Cardiovascular;  Laterality: N/A;    Current Medications: Current Meds  Medication Sig   acetaminophen (TYLENOL) 325 MG tablet Take 650 mg by mouth every 6 (six) hours as needed for mild pain or headache.   aspirin EC 81 MG tablet Take 1 tablet (81 mg total) by mouth daily. Swallow whole.   ibuprofen (ADVIL) 800 MG tablet Take 1 tablet (800 mg total) by mouth 3 (three) times daily.   metFORMIN (GLUCOPHAGE) 500 MG tablet Take 1 tablet (500 mg total) by mouth daily with breakfast.   metoprolol succinate (TOPROL XL) 25 MG 24 hr tablet Take 1 tablet (25 mg total) by mouth daily.   sildenafil (VIAGRA) 25 MG tablet Take 1 tablet (25 mg total) by mouth daily as needed for erectile dysfunction.   valsartan-hydrochlorothiazide (DIOVAN-HCT) 160-12.5 MG tablet TAKE 1 TABLET BY MOUTH EVERY DAY   [DISCONTINUED] atorvastatin (LIPITOR) 40 MG tablet Take 1 tablet (40 mg total) by mouth daily.     Allergies:    Patient  has no known allergies.   Social History: Social History   Socioeconomic History   Marital status: Divorced    Spouse name: Not on file   Number of children: Not on file   Years of education: Not on file   Highest education level: Not on file  Occupational History   Not on file  Tobacco Use   Smoking status: Never   Smokeless tobacco: Never   Tobacco comments:    Used to smoke [employer]  Vaping Use   Vaping Use: Never used  Substance and Sexual Activity   Alcohol use: Yes    Comment: occasional   Drug use: No   Sexual activity: Not on file  Other Topics Concern   Not on file  Social History Narrative    Not on file   Social Determinants of Health   Financial Resource Strain: Not on file  Food Insecurity: Not on file  Transportation Needs: Not on file  Physical Activity: Not on file  Stress: Not on file  Social Connections: Not on file     Family History: The patient's family history includes Healthy in his father; Hypertension in his mother.  ROS:   All other ROS reviewed and negative. Pertinent positives noted in the HPI.     EKGs/Labs/Other Studies Reviewed:   The following studies were personally reviewed by me today:  TTE 12/18/2021  1. Poor acoustic windows limit study, particularly in apical views. LV  cavity becomes small/slit-like towards apex The LV is hyperdynamic with  near cavity obliteration during systole. With Valsalva peak gradient  through LV is 55 mm Hg . Left ventricular   ejection fraction, by estimation, is >75%. The left ventricle has  hyperdynamic function. The left ventricle has no regional wall motion  abnormalities. The left ventricular internal cavity size was low normal.  Moderate to severe left ventricular  hypertrophy.   2. Right ventricular systolic function is normal. The right ventricular  size is normal.   3. Trivial mitral valve regurgitation.   Recent Labs: 04/08/2022: ALT 36 10/20/2022: BUN 9; Creatinine, Ser 1.21; Hemoglobin 14.4; Platelets 262; Potassium 4.0; Sodium 139   Recent Lipid Panel    Component Value Date/Time   CHOL 201 (H) 07/25/2020 1508   TRIG 151 (H) 07/25/2020 1508   HDL 39 (L) 07/25/2020 1508   CHOLHDL 5.2 (H) 07/25/2020 1508   CHOLHDL 4.5 03/09/2020 0405   VLDL 13 03/09/2020 0405   LDLCALC 135 (H) 07/25/2020 1508    Physical Exam:   VS:  BP 126/78 (BP Location: Left Arm, Patient Position: Sitting, Cuff Size: Large)   Pulse 90   Ht 5\' 3"  (1.6 m)   Wt (!) 301 lb (136.5 kg)   BMI 53.32 kg/m    Wt Readings from Last 3 Encounters:  02/15/23 (!) 301 lb (136.5 kg)  08/13/22 297 lb 1.6 oz (134.8 kg)   07/23/22 294 lb 9.6 oz (133.6 kg)    General: Well nourished, well developed, in no acute distress Head: Atraumatic, normal size  Eyes: PEERLA, EOMI  Neck: Supple, no JVD Endocrine: No thryomegaly Cardiac: Normal S1, S2; RRR; no murmurs, rubs, or gallops Lungs: Clear to auscultation bilaterally, no wheezing, rhonchi or rales  Abd: Soft, nontender, no hepatomegaly  Ext: No edema, pulses 2+ Musculoskeletal: No deformities, BUE and BLE strength normal and equal Skin: Warm and dry, no rashes   Neuro: Alert and oriented to person, place, time, and situation, CNII-XII grossly intact, no focal deficits  Psych:  Normal mood and affect   ASSESSMENT:   VIDHUR MADDEX is a 52 y.o. male who presents for the following: 1. Apical variant hypertrophic cardiomyopathy ([place])   2. Essential hypertension   3. Mixed hyperlipidemia   4. Coronary artery disease involving native coronary artery of native heart without angina pectoris   5. Obstructive sleep apnea (adult) (pediatric)   6. Obesity, morbid, BMI 50 or higher ([place])   7. Erectile dysfunction, unspecified erectile dysfunction type     PLAN:   1. Apical variant hypertrophic cardiomyopathy (HCC) -Apical variant HCM.  No outflow tract obstruction as this is in the apex.  No syncope.  3-day ZIO was negative.  Will repeat his monitor and echo.  All of his testing has been stable.  No family history of sudden death.  9% late gadolinium enhancement on cardiac MRI.  No indications for ICD at this time.  All of his family members have been screened.  Overall seems to be doing well.  We will repeat an echo and monitor to follow this condition.  2. Essential hypertension -Well-controlled on current medications.  No change.  3. Mixed hyperlipidemia 4. Coronary artery disease involving native coronary artery of native heart without angina pectoris -History of nonobstructive CAD.  Continue aspirin 81 mg daily.  LDL not at goal.  Increase Lipitor to 40 mg  daily.  We discussed weight loss will be beneficial here.  No symptoms of angina.  He is on a beta-blocker.  5. Obstructive sleep apnea (adult) (pediatric) -Severe sleep apnea.  We will follow-up with sleep medicine to determine where he is for his CPAP machine.  6. Obesity, morbid, BMI 50 or higher (HCC) -Weight loss encouraged.  7. Erectile dysfunction, unspecified erectile dysfunction type -25 mg of Viagra as needed.     Disposition: No follow-ups on file.  Medication Adjustments/Labs and Tests Ordered: Current medicines are reviewed at length with the patient today.  Concerns regarding medicines are outlined above.  Orders Placed This Encounter  Procedures   LONG TERM MONITOR (3-14 DAYS)   Meds ordered this encounter  Medications   atorvastatin (LIPITOR) 80 MG tablet    Sig: Take 1 tablet (80 mg total) by mouth daily.    Dispense:  90 tablet    Refill:  3   sildenafil (VIAGRA) 25 MG tablet    Sig: Take 1 tablet (25 mg total) by mouth daily as needed for erectile dysfunction.    Dispense:  30 tablet    Refill:  0    Patient Instructions  Medication Instructions:  INCREASE Lipitor to 80 mg daily   *If you need a refill on your cardiac medications before your next appointment, please call your pharmacy*   Testing/Procedures:  ZIO XT- Long Term Monitor Instructions  Your physician has requested you wear a ZIO patch monitor for 3 days.  This is a single patch monitor. [employer] supplies one patch monitor per enrollment. Additional stickers are not available. Please do not apply patch if you will be having a Nuclear Stress Test,  Echocardiogram, Cardiac CT, MRI, or Chest Xray during the period you would be wearing the  monitor. The patch cannot be worn during these tests. You cannot remove and re-apply the  ZIO XT patch monitor.  Your ZIO patch monitor will be mailed 3 day USPS to your address on file. It may take 3-5 days  to receive your monitor after you have been  enrolled.  Once you have received your monitor, please review  the enclosed instructions. Your monitor  has already been registered assigning a specific monitor serial # to you.  Billing and Patient Assistance Program Information  We have supplied [employer] with any of your insurance information on file for billing purposes. [employer] offers a sliding scale Patient Assistance Program for patients that do not have  insurance, or whose insurance does not completely cover the cost of the ZIO monitor.  You must apply for the Patient Assistance Program to qualify for this discounted rate.  To apply, please call [employer] at [phone], select option 4, select option 2, ask to apply for  Patient Assistance Program. Theodore Demark will ask your household income, and how many people  are in your household. They will quote your out-of-pocket cost based on that information.  [employer] will also be able to set up a 46-month, interest-free payment plan if needed.  Applying the monitor   Shave hair from upper left chest.  Hold abrader disc by orange tab. Rub abrader in 40 strokes over the upper left chest as  indicated in your monitor instructions.  Clean area with 4 enclosed alcohol pads. Let dry.  Apply patch as indicated in monitor instructions. Patch will be placed under collarbone on left  side of chest with arrow pointing upward.  Rub patch adhesive wings for 2 minutes. Remove white label marked "1". Remove the white  label marked "2". Rub patch adhesive wings for 2 additional minutes.  While looking in a mirror, press and release button in center of patch. A small green light will  flash 3-4 times. This will be your only indicator that the monitor has been turned on.  Do not shower for the first 24 hours. You may shower after the first 24 hours.  Press the button if you feel a symptom. You will hear a small click. Record Date, Time and  Symptom in the Patient Logbook.  When you are ready to remove the  patch, follow instructions on the last 2 pages of Patient  Logbook. Stick patch monitor onto the last page of Patient Logbook.  Place Patient Logbook in the blue and white box. Use locking tab on box and tape box closed  securely. The blue and white box has prepaid postage on it. Please place it in the mailbox as  soon as possible. Your physician should have your test results approximately 7 days after the  monitor has been mailed back to [facility].  Call [place] at [phone] if you have questions regarding  your ZIO XT patch monitor. Call them immediately if you see an orange light blinking on your  monitor.  If your monitor falls off in less than 4 days, contact our Monitor department at [phone].  If your monitor becomes loose or falls off after 4 days call [employer] at [phone] for  suggestions on securing your monitor    Follow-Up: At [facility], you and your health needs are our priority.  As part of our continuing mission to provide you with exceptional heart care, we have created designated Provider Care Teams.  These Care Teams include your primary Cardiologist (physician) and Advanced Practice Providers (APPs -  Physician Assistants and Nurse Practitioners) who all work together to provide you with the care you need, when you need it.  We recommend signing up for the patient portal called "MyChart".  Sign up information is provided on this After Visit Summary.  MyChart is used to connect with patients for Virtual Visits (Telemedicine).  Patients  are able to view lab/test results, encounter notes, upcoming appointments, etc.  Non-urgent messages can be sent to your provider as well.   To learn more about what you can do with MyChart, go to [email or web address].    Your next appointment:   12 month(s)  Provider:   Evalina Field, MD      Time Spent with Patient: I have spent a total of 35 minutes with patient reviewing hospital  notes, telemetry, EKGs, labs and examining the patient as well as establishing an assessment and plan that was discussed with the patient.  > 50% of time was spent in direct patient care.  Signed, Addison Naegeli. Audie Box, MD, [place]  [address], [place], [place] [address] [phone]  02/15/2023 12:11 PM

## 2023-02-15 ENCOUNTER — Encounter: Payer: Self-pay | Admitting: Cardiovascular Disease

## 2023-02-15 ENCOUNTER — Ambulatory Visit (INDEPENDENT_AMBULATORY_CARE_PROVIDER_SITE_OTHER): Payer: Commercial Managed Care - PPO

## 2023-02-15 ENCOUNTER — Ambulatory Visit: Payer: Commercial Managed Care - PPO | Attending: Cardiovascular Disease | Admitting: Cardiovascular Disease

## 2023-02-15 VITALS — BP 126/78 | HR 90 | Ht 63.0 in | Wt 301.0 lb

## 2023-02-15 DIAGNOSIS — I10 Essential (primary) hypertension: Secondary | ICD-10-CM | POA: Diagnosis not present

## 2023-02-15 DIAGNOSIS — E782 Mixed hyperlipidemia: Secondary | ICD-10-CM | POA: Diagnosis not present

## 2023-02-15 DIAGNOSIS — I422 Other hypertrophic cardiomyopathy: Secondary | ICD-10-CM | POA: Diagnosis not present

## 2023-02-15 DIAGNOSIS — I2510 Atherosclerotic heart disease of native coronary artery without angina pectoris: Secondary | ICD-10-CM | POA: Diagnosis not present

## 2023-02-15 DIAGNOSIS — G4733 Obstructive sleep apnea (adult) (pediatric): Secondary | ICD-10-CM

## 2023-02-15 DIAGNOSIS — N529 Male erectile dysfunction, unspecified: Secondary | ICD-10-CM

## 2023-02-15 MED ORDER — ATORVASTATIN CALCIUM 80 MG PO TABS
80.0000 mg | ORAL_TABLET | Freq: Every day | ORAL | 3 refills | Status: DC
Start: 2023-02-15 — End: 2024-04-29

## 2023-02-15 MED ORDER — SILDENAFIL CITRATE 25 MG PO TABS
25.0000 mg | ORAL_TABLET | Freq: Every day | ORAL | 0 refills | Status: DC | PRN
Start: 2023-02-15 — End: 2024-10-02

## 2023-02-15 NOTE — Patient Instructions (Signed)
Medication Instructions:  INCREASE Lipitor to 80 mg daily   *If you need a refill on your cardiac medications before your next appointment, please call your pharmacy*   Testing/Procedures:  [place] Monitor Instructions  Your physician has requested you wear a ZIO patch monitor for 3 days.  This is a single patch monitor. [employer] supplies one patch monitor per enrollment. Additional stickers are not available. Please do not apply patch if you will be having a Nuclear Stress Test,  Echocardiogram, Cardiac CT, MRI, or Chest Xray during the period you would be wearing the  monitor. The patch cannot be worn during these tests. You cannot remove and re-apply the  ZIO XT patch monitor.  Your ZIO patch monitor will be mailed 3 day USPS to your address on file. It may take 3-5 days  to receive your monitor after you have been enrolled.  Once you have received your monitor, please review the enclosed instructions. Your monitor  has already been registered assigning a specific monitor serial # to you.  Billing and Patient Assistance Program Information  We have supplied [employer] with any of your insurance information on file for billing purposes. [employer] offers a sliding scale Patient Assistance Program for patients that do not have  insurance, or whose insurance does not completely cover the cost of the ZIO monitor.  You must apply for the Patient Assistance Program to qualify for this discounted rate.  To apply, please call [employer] at [phone], select option 4, select option 2, ask to apply for  Patient Assistance Program. Theodore Demark will ask your household income, and how many people  are in your household. They will quote your out-of-pocket cost based on that information.  [employer] will also be able to set up a 42-month, interest-free payment plan if needed.  Applying the monitor   Shave hair from upper left chest.  Hold abrader disc by orange tab. Rub abrader in 40 strokes over  the upper left chest as  indicated in your monitor instructions.  Clean area with 4 enclosed alcohol pads. Let dry.  Apply patch as indicated in monitor instructions. Patch will be placed under collarbone on left  side of chest with arrow pointing upward.  Rub patch adhesive wings for 2 minutes. Remove white label marked "1". Remove the white  label marked "2". Rub patch adhesive wings for 2 additional minutes.  While looking in a mirror, press and release button in center of patch. A small green light will  flash 3-4 times. This will be your only indicator that the monitor has been turned on.  Do not shower for the first 24 hours. You may shower after the first 24 hours.  Press the button if you feel a symptom. You will hear a small click. Record Date, Time and  Symptom in the Patient Logbook.  When you are ready to remove the patch, follow instructions on the last 2 pages of Patient  Logbook. Stick patch monitor onto the last page of Patient Logbook.  Place Patient Logbook in the blue and white box. Use locking tab on box and tape box closed  securely. The blue and white box has prepaid postage on it. Please place it in the mailbox as  soon as possible. Your physician should have your test results approximately 7 days after the  monitor has been mailed back to [facility].  Call [place] at [phone] if you have questions regarding  your ZIO XT patch monitor. Call them  immediately if you see an orange light blinking on your  monitor.  If your monitor falls off in less than 4 days, contact our Monitor department at [phone].  If your monitor becomes loose or falls off after 4 days call [employer] at [phone] for  suggestions on securing your monitor    Follow-Up: At [facility], you and your health needs are our priority.  As part of our continuing mission to provide you with exceptional heart care, we have created designated Provider Care  Teams.  These Care Teams include your primary Cardiologist (physician) and Advanced Practice Providers (APPs -  Physician Assistants and Nurse Practitioners) who all work together to provide you with the care you need, when you need it.  We recommend signing up for the patient portal called "MyChart".  Sign up information is provided on this After Visit Summary.  MyChart is used to connect with patients for Virtual Visits (Telemedicine).  Patients are able to view lab/test results, encounter notes, upcoming appointments, etc.  Non-urgent messages can be sent to your provider as well.   To learn more about what you can do with MyChart, go to [email or web address].    Your next appointment:   12 month(s)  Provider:   Evalina Field, MD

## 2023-02-15 NOTE — Progress Notes (Unsigned)
Enrolled patient for a 3 day Zio XT monitor to be mailed to patients home  

## 2023-02-24 DIAGNOSIS — I422 Other hypertrophic cardiomyopathy: Secondary | ICD-10-CM | POA: Diagnosis not present

## 2023-08-21 ENCOUNTER — Telehealth: Payer: Self-pay | Admitting: Cardiovascular Disease

## 2023-08-21 NOTE — Telephone Encounter (Signed)
Patient calling with questions with sleep monitor. Please advise

## 2023-10-10 ENCOUNTER — Telehealth: Payer: Self-pay | Admitting: Cardiovascular Disease

## 2023-10-10 NOTE — Telephone Encounter (Signed)
Patient stated he was sent to [facility] after his previous CPAP supplies provider closed.  Patient stated he will need his information transferred to Reginald Gonzalez so he can get his CPAP supplies.

## 2023-10-21 ENCOUNTER — Other Ambulatory Visit: Payer: Self-pay

## 2023-10-21 DIAGNOSIS — G4733 Obstructive sleep apnea (adult) (pediatric): Secondary | ICD-10-CM

## 2023-12-26 IMAGING — CT CT ABD-PELV W/ CM
2 of 5 series · 16 of 46 positions shown, 18 images · IV contrast (APPLIED)
Comparison: None Available.

CLINICAL DATA: Left lower quadrant pain.

EXAM:
CT ABDOMEN AND PELVIS WITH CONTRAST
TECHNIQUE: Multidetector CT imaging of the abdomen and pelvis was performed
using the standard protocol following bolus administration of
intravenous contrast.

[Series 3: abdomen 5.0 · axial · 0.85mm/px · z∈[+1440,+1860]mm · 13 of 98 slices shown, 15 images]
[im 7/98  soft-tissue]
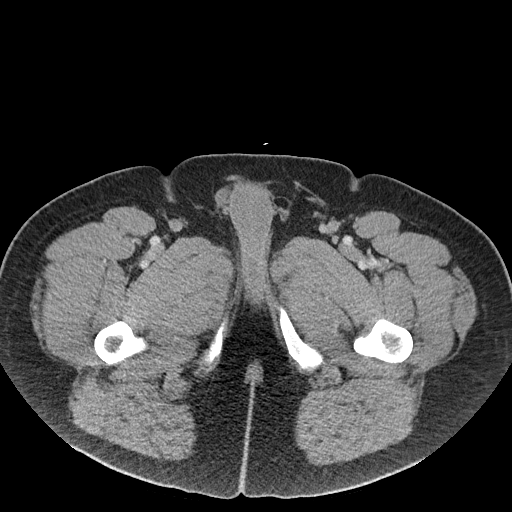
[im 7/98  bone]
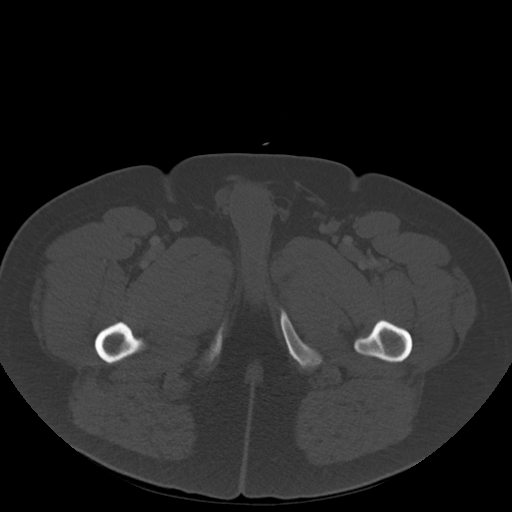
[im 14/98  soft-tissue]
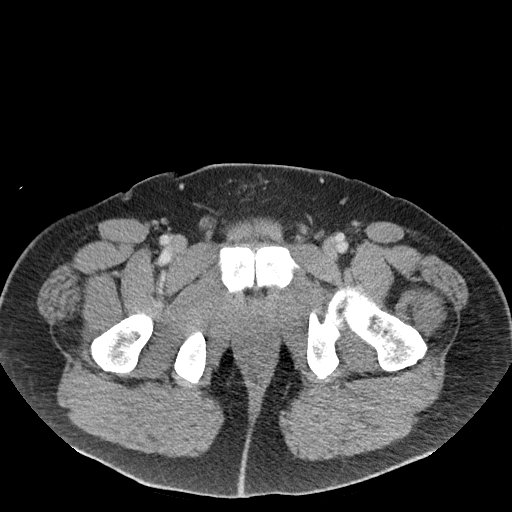
[im 21/98  soft-tissue]
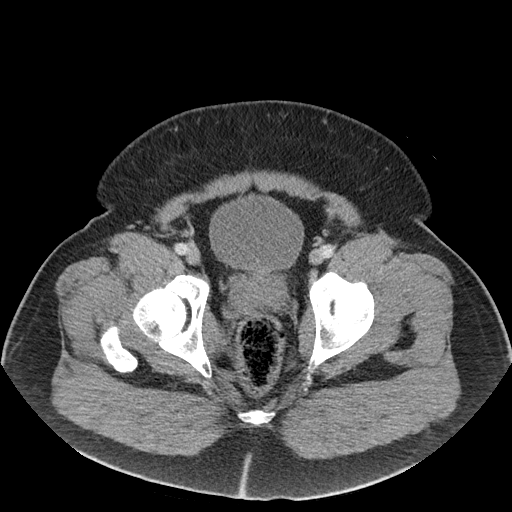
[im 28/98  soft-tissue]
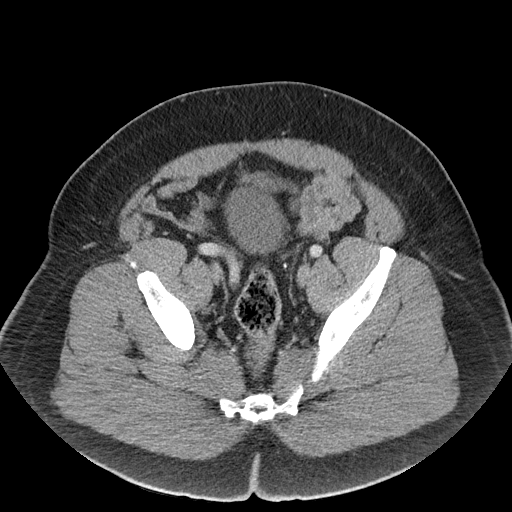
[im 35/98  soft-tissue]
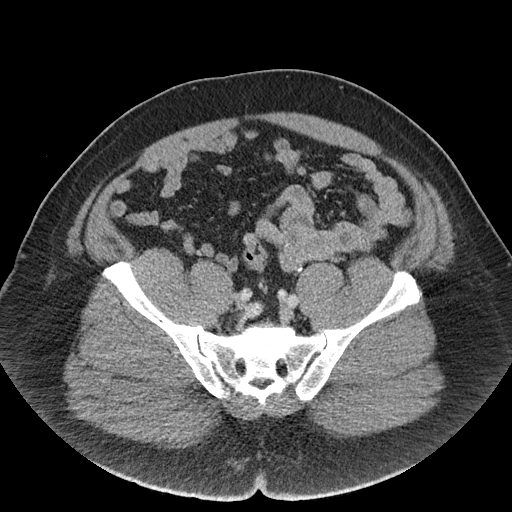
[im 42/98  soft-tissue]
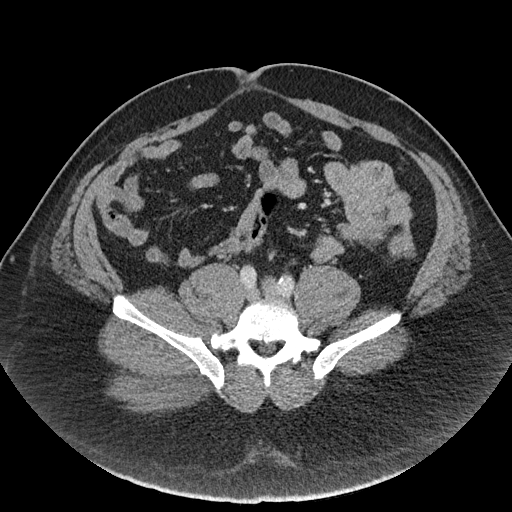
[im 49/98  soft-tissue]
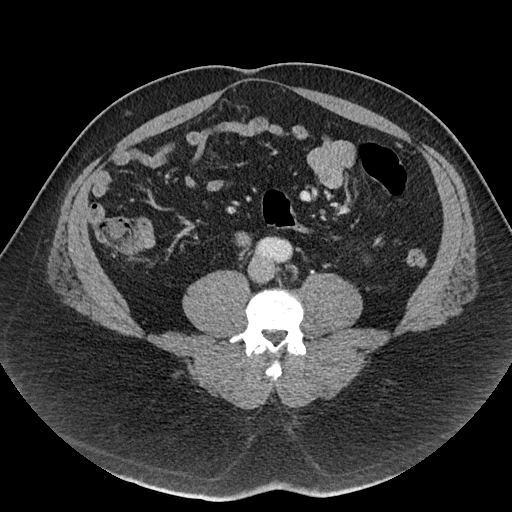
[im 56/98  soft-tissue]
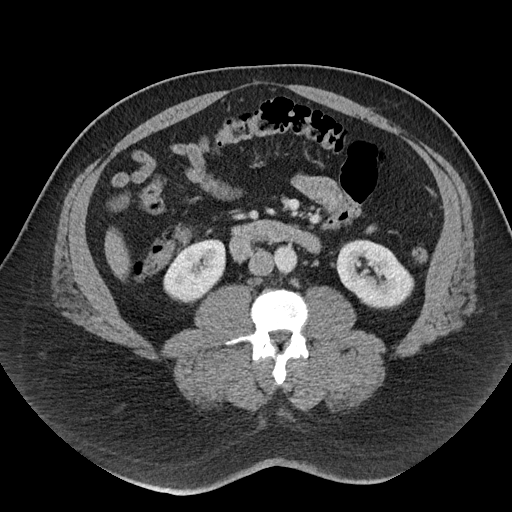
[im 63/98  soft-tissue]
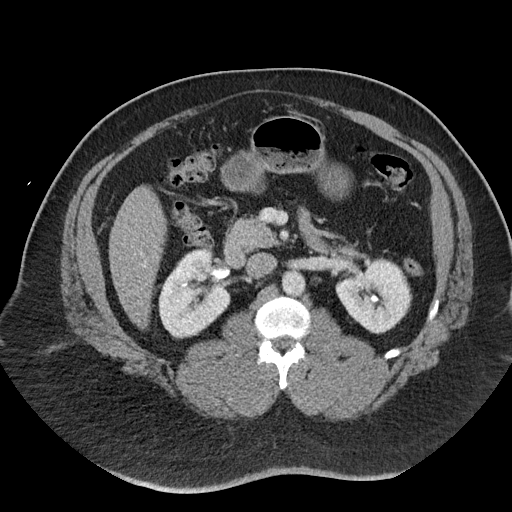
[im 63/98  bone]
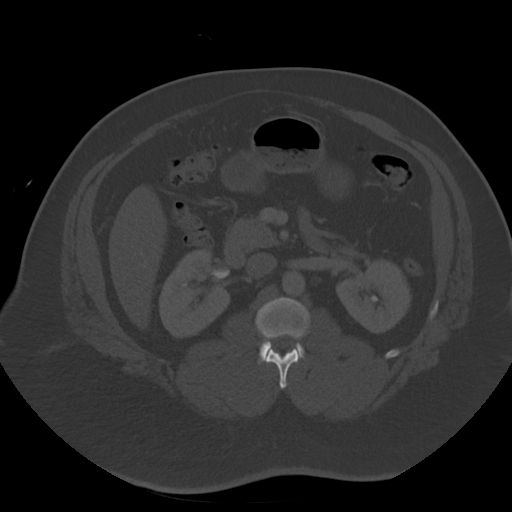
[im 70/98  soft-tissue]
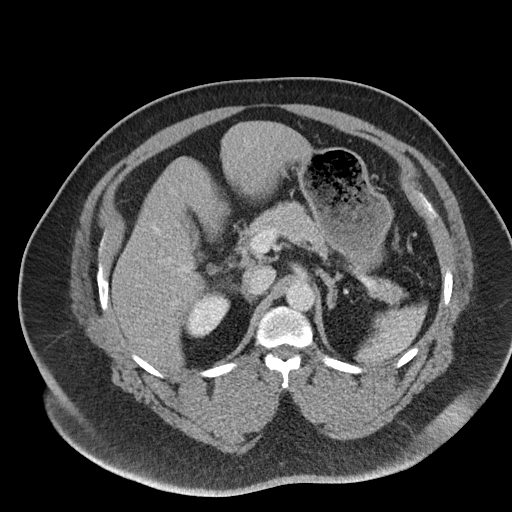
[im 77/98  soft-tissue]
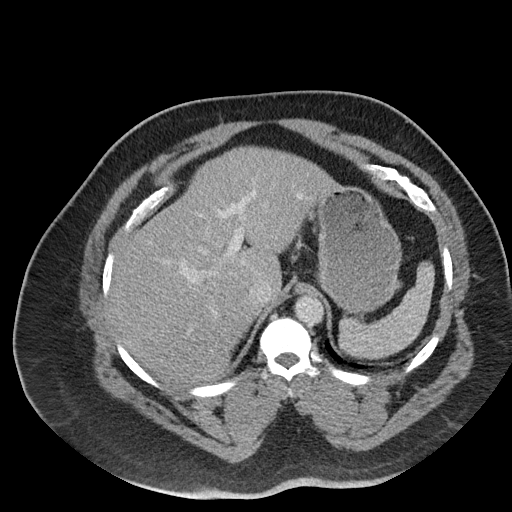
[im 84/98  soft-tissue]
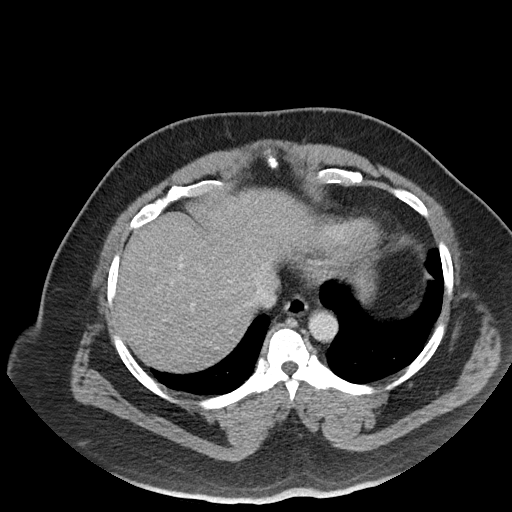
[im 91/98  soft-tissue]
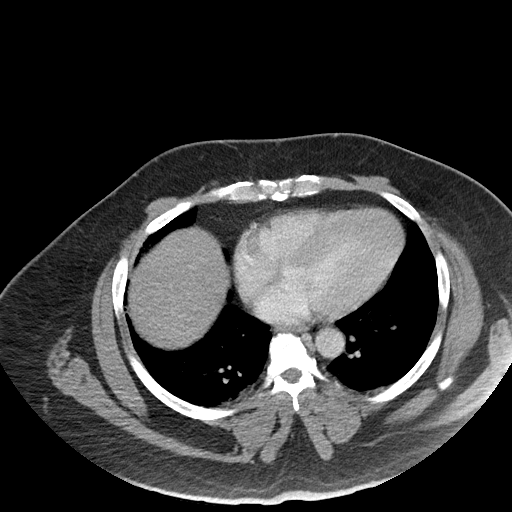

[Series 6: abdomen 3.0 mpr cor · coronal · 0.99mm/px · 3 of 116 slices shown]
[im 39/116  soft-tissue]
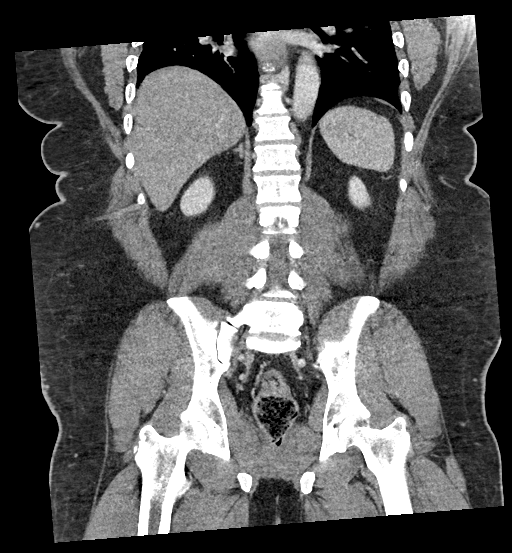
[im 52/116  soft-tissue]
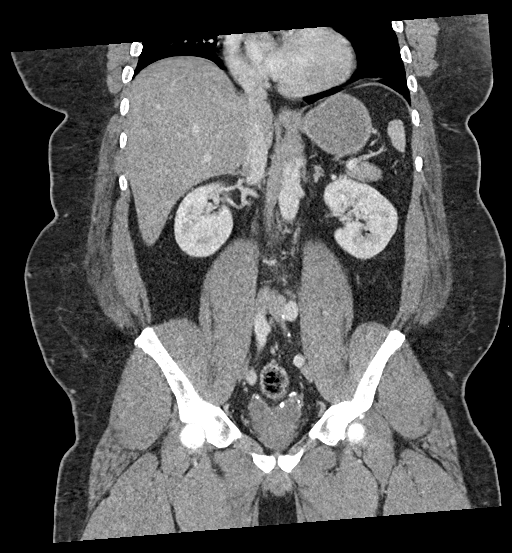
[im 64/116  soft-tissue]
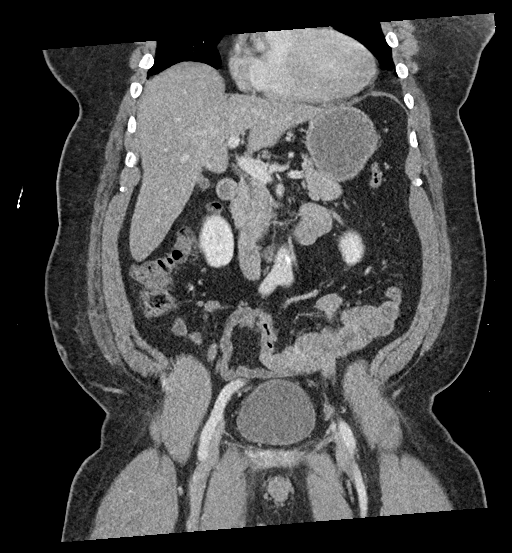

[16 of 46 positions shown; findings below may reference images not displayed]

RADIATION DOSE REDUCTION: This exam was performed according to the
departmental dose-optimization program which includes automated
exposure control, adjustment of the mA and/or kV according to
patient size and/or use of iterative reconstruction technique.

CONTRAST:  100mL OMNIPAQUE IOHEXOL 300 MG/ML  SOLN
FINDINGS: Lower chest: Mild atelectasis is seen within the right lung base.

Hepatobiliary: There is diffuse fatty infiltration of the liver
parenchyma. No focal liver abnormality is seen. No gallstones,
gallbladder wall thickening, or biliary dilatation.

Pancreas: Unremarkable. No pancreatic ductal dilatation or
surrounding inflammatory changes.

Spleen: Normal in size without focal abnormality.

Adrenals/Urinary Tract: Adrenal glands are unremarkable. Kidneys are
normal, without renal calculi, focal lesion, or hydronephrosis.
Bladder is unremarkable.

Stomach/Bowel: Stomach is within normal limits. Appendix appears
normal. No evidence of bowel wall thickening, distention, or
inflammatory changes.

Vascular/Lymphatic: No significant vascular findings are present. No
enlarged abdominal or pelvic lymph nodes.

Reproductive: Prostate is unremarkable.

Other: No abdominal wall hernia or abnormality. No abdominopelvic
ascites.

Musculoskeletal: No acute or significant osseous findings.
IMPRESSION: Hepatic steatosis.

## 2024-01-28 ENCOUNTER — Telehealth: Payer: Self-pay | Admitting: Cardiovascular Disease

## 2024-01-28 NOTE — Telephone Encounter (Signed)
 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

## 2024-01-31 NOTE — Telephone Encounter (Signed)
 Late entry, spoke with patient on 3/4, last 30 days of CPAP data download has been faxed to provided number for DOT Physical.

## 2024-04-29 ENCOUNTER — Other Ambulatory Visit: Payer: Self-pay | Admitting: Cardiovascular Disease

## 2024-08-04 ENCOUNTER — Ambulatory Visit: Payer: Self-pay | Admitting: Cardiovascular Disease

## 2024-10-02 ENCOUNTER — Encounter: Payer: Self-pay | Admitting: Cardiovascular Disease

## 2024-10-02 ENCOUNTER — Ambulatory Visit: Payer: Self-pay | Attending: Cardiovascular Disease | Admitting: Cardiovascular Disease

## 2024-10-02 VITALS — BP 132/86 | HR 104 | Ht 64.0 in | Wt 307.8 lb

## 2024-10-02 DIAGNOSIS — I2510 Atherosclerotic heart disease of native coronary artery without angina pectoris: Secondary | ICD-10-CM | POA: Diagnosis not present

## 2024-10-02 DIAGNOSIS — I422 Other hypertrophic cardiomyopathy: Secondary | ICD-10-CM

## 2024-10-02 DIAGNOSIS — E118 Type 2 diabetes mellitus with unspecified complications: Secondary | ICD-10-CM

## 2024-10-02 DIAGNOSIS — E782 Mixed hyperlipidemia: Secondary | ICD-10-CM | POA: Diagnosis not present

## 2024-10-02 DIAGNOSIS — I10 Essential (primary) hypertension: Secondary | ICD-10-CM | POA: Diagnosis not present

## 2024-10-02 DIAGNOSIS — N529 Male erectile dysfunction, unspecified: Secondary | ICD-10-CM

## 2024-10-02 LAB — CBC

## 2024-10-02 MED ORDER — VALSARTAN-HYDROCHLOROTHIAZIDE 160-12.5 MG PO TABS: 1.0000 | ORAL_TABLET | Freq: Every day | ORAL | 2 refills | Status: AC

## 2024-10-02 MED ORDER — METFORMIN HCL 500 MG PO TABS: 500.0000 mg | ORAL_TABLET | Freq: Every day | ORAL | 1 refills | Status: AC

## 2024-10-02 MED ORDER — ATORVASTATIN CALCIUM 80 MG PO TABS: 80.0000 mg | ORAL_TABLET | Freq: Every day | ORAL | 1 refills | Status: DC

## 2024-10-02 MED ORDER — METOPROLOL SUCCINATE ER 25 MG PO TB24: 25.0000 mg | ORAL_TABLET | Freq: Every day | ORAL | 11 refills | Status: DC

## 2024-10-02 MED ORDER — SILDENAFIL CITRATE 25 MG PO TABS
100.0000 mg | ORAL_TABLET | Freq: Every day | ORAL | 0 refills | Status: AC | PRN
Start: 2024-10-02 — End: ?

## 2024-10-02 NOTE — Progress Notes (Signed)
 Cardiology Office Note:  .   Date:  10/02/2024  ID:  Reginald Gonzalez, DOB 26-Feb-1971, MRN [id number] PCP: Leigh Lung, MD  [facility] Providers Cardiologist:  Darryle ONEIDA Decent, MD    History of Present Illness: .    Chief Complaint  Patient presents with   Follow-up    Reginald Gonzalez is a 53 y.o. male with below history who presents for follow-up.    History of Present Illness   Reginald Gonzalez is a 53 year old male with apical variant hypertrophic cardiomyopathy, CAD, HTN, HLD, obesity, DM who presents for follow-up.  He experiences chest pain, described as sharp, dull, or achy, primarily occurring at night. No episodes of syncope, dizziness, or lightheadedness. Occasional ankle swelling is noted.  He is currently not taking atorvastatin , metoprolol , valsartan  hydrochlorothiazide , or metformin  due to running out of these medications. He confirms taking aspirin  regularly.   A1c 7.0 in March. Has not seen his PCP.   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  He has a history of a negative heart monitor test from the previous year and recalls undergoing a cardiac MRI about four years ago without any issues. No syncope.          Problem List 1. Apical Variant Hypertrophic CM -3 day zio negative for VT -no syncope -no FH SCD -9% LGE 2. Non-obstructive CAD/NSTEMI -02/2020: 40% RCA, 50% mid LCX -vasospasm noticed during cath 3. DM -A1c 7.0 4. HTN 5. HLD -T chol 150, HDL 41, LDL 85, TG 137 6. Obesity (BMI 53) 7. OSA -severe with hypoxia     ROS: All other ROS reviewed and negative. Pertinent positives noted in the HPI.     Studies Reviewed: SABRA   EKG Interpretation Date/Time:  Friday October 02 2024 09:59:31 EST Ventricular Rate:  104 PR Interval:  150 QRS Duration:  86 QT Interval:  348 QTC Calculation: 457 R Axis:   1  Text Interpretation: Sinus tachycardia Left ventricular  hypertrophy with repolarization abnormality ( R in aVL ) Cannot rule out Septal infarct (cited on or before 20-Oct-2022) Confirmed by Decent Darryle [phone]) on 10/02/2024 10:03:26 AM   CMR 03/10/2020 IMPRESSION: 1. Asymmetric LV hypertrophy, most pronounced in apex with apical lateral wall measuring up to 18mm (basal posterior wall 8mm). This is consistent with apical variant hypertrophic cardiomyopathy   2. Patchy late gadolinium enhancement predominantly at LV apex, consistent with HCM. LGE accounts for 9% of total myocardial mass   3.   No evidence of myocarditis with normal native T1,T2, and ECV   4.   Normal LV size with hyperdynamic systolic function (EF 73%)   5.   Normal RV size and systolic function (EF 67%) Physical Exam:   VS:  BP 132/86 (BP Location: Left Arm, Patient Position: Sitting)   Pulse (!) 104   Ht 5' 4 (1.626 m)   Wt (!) 307 lb 12.8 oz (139.6 kg)   SpO2 98%   BMI 52.83 kg/m    Wt Readings from Last 3 Encounters:  10/02/24 (!) 307 lb 12.8 oz (139.6 kg)  02/15/23 (!) 301 lb (136.5 kg)  08/13/22 297 lb 1.6 oz (134.8 kg)    GEN: Well nourished, well developed in no acute distress NECK: No JVD; No carotid bruits CARDIAC: RRR, no murmurs, rubs, gallops RESPIRATORY:  Clear to auscultation without rales, wheezing or rhonchi  ABDOMEN: Soft, non-tender, non-distended  EXTREMITIES:  No edema; No deformity  ASSESSMENT AND PLAN: .   Assessment and Plan    Apical variant hypertrophic cardiomyopathy Thickened heart muscle. No syncope or angina symptoms. Occasional nighttime chest pain. - Ordered cardiac MRI for surveillance.  - Continue current medications. - no Fam Hx of SCD, no syncope. Negative zio last year.   Atherosclerotic heart disease of native coronary artery without angina Nonobstructive coronary artery disease. No angina symptoms. - Continue aspirin  81 mg daily. - Statin refilled.   Essential hypertension - Refilled metoprolol  succinate 50 mg  daily. - Refilled valsartan  160 mg with HCTZ 12.5 mg.  Mixed hyperlipidemia Cholesterol levels well-managed with atorvastatin . - Recheck lipids. Continue lipitor 80 mg daily.   Type 2 diabetes mellitus Suboptimal diabetes management due to non-adherence to metformin . Discussed GLP-1 agonists for weight loss and diabetes management. - Refilled metformin  500 mg BID. - Ordered full panel of labs including CBC, BMP, lipids, TSH, and A1c. - Encouraged follow-up with primary care physician for GLP-1 agonist consideration.  Morbid obesity Discussed importance of weight loss for health improvement. Consideration of GLP-1 agonists for weight loss. - Encouraged weight loss through dietary changes and exercise. - Discussed GLP-1 agonists with primary care physician for weight loss.   Erectile Dysfunction - no response to 25 mg of viagra . We will try 100 mg tablets.              Follow-up: No follow-ups on file.  Signed, Darryle DASEN. Barbaraann, MD, [facility]  [facility]  [address] [place], [place] [address] [phone]  10:36 AM

## 2024-10-02 NOTE — Patient Instructions (Addendum)
 Medication Instructions:  Your physician has recommended you make the following change in your medication:  Increase viagra  to 100mg  as needed  Lab Work: CBC, CMP, Lipid, A1c, TSH -- Today  You may go to any [facility] Location for your lab work:  [facility] - [address] ([facility]) - [address] [phone] [address]  [place] - [address]   [place]  - [address]   [place] - [address] - [address] [place]  - [address] [place] - [address] ([facility]   If you have labs (blood work) drawn today and your tests are completely normal, you will receive your results only by: [employer] (if you have MyChart)  If you have any lab test that is abnormal or we need to change your treatment, we will call you or send a MyChart message to review the results.  Testing/Procedures: Your physician has requested that you have a cardiac MRI. Cardiac MRI uses a computer to create images of your heart as its beating, producing both still and moving pictures of your heart and major blood vessels. For further information please visit [email or web address]. Please follow the instruction sheet given to you today for more information.   Follow-Up: At [facility], you and your health needs are our priority.  As part of our continuing mission to provide you with exceptional heart care, we have created designated Provider Care Teams.  These Care Teams include your primary Cardiologist (physician) and Advanced Practice Providers (APPs -  Physician Assistants and Nurse Practitioners) who all work together to provide you with the care you need, when you need it.   Your next appointment:   1 year(s)  The format for your next appointment:   In Person  Provider:   Dr Barbaraann

## 2024-10-03 LAB — COMPREHENSIVE METABOLIC PANEL WITH GFR
ALT: 39 IU/L (ref 0–44)
AST: 33 IU/L (ref 0–40)
Albumin: 4 g/dL (ref 3.8–4.9)
Alkaline Phosphatase: 137 IU/L — ABNORMAL HIGH (ref 47–123)
BUN/Creatinine Ratio: 10 (ref 9–20)
BUN: 11 mg/dL (ref 6–24)
Bilirubin Total: 0.3 mg/dL (ref 0.0–1.2)
CO2: 25 mmol/L (ref 20–29)
Calcium: 9.4 mg/dL (ref 8.7–10.2)
Chloride: 101 mmol/L (ref 96–106)
Creatinine, Ser: 1.14 mg/dL (ref 0.76–1.27)
Globulin, Total: 3.2 g/dL (ref 1.5–4.5)
Glucose: 122 mg/dL — ABNORMAL HIGH (ref 70–99)
Potassium: 4.1 mmol/L (ref 3.5–5.2)
Sodium: 141 mmol/L (ref 134–144)
Total Protein: 7.2 g/dL (ref 6.0–8.5)
eGFR: 77 mL/min/1.73 (ref >59)

## 2024-10-03 LAB — CBC
Hematocrit: 44.4 % (ref 37.5–51.0)
Hemoglobin: 14.5 g/dL (ref 13.0–17.7)
MCH: 26.5 pg — ABNORMAL LOW (ref 26.6–33.0)
MCHC: 32.7 g/dL (ref 31.5–35.7)
MCV: 81 fL (ref 79–97)
Platelets: 283 x10E3/uL (ref 150–450)
RBC: 5.47 x10E6/uL (ref 4.14–5.80)
RDW: 14.6 % (ref 11.6–15.4)
WBC: 8.7 x10E3/uL (ref 3.4–10.8)

## 2024-10-03 LAB — LIPID PANEL
Chol/HDL Ratio: 4.1 ratio (ref 0.0–5.0)
Cholesterol, Total: 165 mg/dL (ref 100–199)
HDL: 40 mg/dL (ref >39)
LDL Chol Calc (NIH): 89 mg/dL (ref 0–99)
Triglycerides: 215 mg/dL — ABNORMAL HIGH (ref 0–149)
VLDL Cholesterol Cal: 36 mg/dL (ref 5–40)

## 2024-10-05 ENCOUNTER — Ambulatory Visit: Payer: Self-pay | Admitting: Cardiovascular Disease

## 2024-10-07 ENCOUNTER — Ambulatory Visit: Payer: Self-pay | Admitting: Cardiovascular Disease

## 2024-10-07 DIAGNOSIS — I422 Other hypertrophic cardiomyopathy: Secondary | ICD-10-CM

## 2024-10-21 ENCOUNTER — Encounter (HOSPITAL_COMMUNITY): Payer: Self-pay

## 2024-10-27 ENCOUNTER — Ambulatory Visit (HOSPITAL_COMMUNITY)
Admission: RE | Admit: 2024-10-27 | Discharge: 2024-10-27 | Disposition: A | Source: Ambulatory Visit | Attending: Cardiovascular Disease

## 2024-10-27 ENCOUNTER — Other Ambulatory Visit: Payer: Self-pay | Admitting: Cardiovascular Disease

## 2024-10-27 DIAGNOSIS — I422 Other hypertrophic cardiomyopathy: Secondary | ICD-10-CM | POA: Insufficient documentation

## 2024-10-27 MED ORDER — GADOBUTROL 1 MMOL/ML IV SOLN
16.0000 mL | Freq: Once | INTRAVENOUS | Status: AC | PRN
  Administered 2024-10-27: 16 mL via INTRAVENOUS

## 2024-11-01 NOTE — Telephone Encounter (Signed)
 Called Reginald Gonzalez.  Discussed results of his cardiac MRI.  He has apical variant hypertrophic cardiomyopathy.  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  ICD is a 2A recommendation.  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  I will arrange for him to see me on 11/10/2024.  We will then arrange for him to have a second opinion with a hypertrophic cardiomyopathy specialist.  We discussed recommendations per the [employer] safety administration. https://www.fmcsa.dot.gov/regulations/medical/recommended-changes-cardiovascular-disease-guidelines#:~:text=The%20current%20guidelines%20state%20that,for%20sudden%20incapacitation%20or%20death.  The recommendation as of 2015 was that patients with hypertrophic cardiomyopathy were allowed to drive commercially as long as they were deemed a low risk and did not have a high risk feature. He has no high risk features from the 2015 [facility] document, but his apical aneurysm is concerning. His condition of apical variant hypertrophic cardiomyopathy does not necessarily preclude him immediately from driving however the risk factor of apical aneurysm is concerning.  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  I will assist him with short-term disability.  He was in agreement with the plan.  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  He will see us  in the office to discuss further.  Signed, Reginald Gonzalez. Barbaraann, MD, [facility]  [facility]  [address] [place], [place] [address] [phone]  2:43 PM

## 2024-11-02 ENCOUNTER — Other Ambulatory Visit: Payer: Self-pay | Admitting: Cardiovascular Disease

## 2024-11-02 NOTE — Addendum Note (Signed)
 Addended by: LORRENE FEDERICO CROME on: 11/02/2024 10:25 AM   Modules accepted: Orders

## 2024-11-04 NOTE — Progress Notes (Signed)
 Cardiology Office Note:  .   Date:  11/10/2024  ID:  Lynwood ONEIDA Muzzy, DOB [birth year], MRN [id number] PCP: Leigh Lung, MD  [facility] Providers Cardiologist:  Darryle ONEIDA Decent, MD   History of Present Illness: .    Chief Complaint  Patient presents with   Follow-up         TAHA DIMOND is a 53 y.o. male with below history who presents for follow-up.   History of Present Illness   SHADEED COLBERG is a 53 year old male with apical variant hypertrophic cardiomyopathy who presents for follow-up.  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 Four years ago, there was no scarring in his heart, and he had no arrhythmias on monitoring. Recent findings show scarring in the heart. No dizziness, lightheadedness, chest pains, or episodes of syncope.  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 He is currently taking metoprolol  succinate 25 mg daily, valsartan  160 mg with HCTZ 12.5 mg, atorvastatin  80 mg, and metformin  500 mg daily. He requests a refill for atorvastatin .  He has a history of diabetes with a recent A1c of 7.0, hypertension, hyperlipidemia, morbid obesity with a BMI of 53, and obstructive sleep apnea. He is concerned about his ability to work and mentions the need to find alternative employment due to his condition.           Problem List 1. Apical Variant Hypertrophic CM -3 day zio negative for VT -no syncope -no FH SCD -14% LGE; apical aneurysm 10/27/2024 2. Non-obstructive CAD/NSTEMI -02/2020: 40% RCA, 50% mid LCX -vasospasm noticed during cath 3. DM -A1c 7.0 4. HTN 5. HLD -T chol 150, HDL 41, LDL 85, TG 137 6. Obesity (BMI 53) 7. OSA -severe with hypoxia     ROS: All other ROS reviewed and negative. Pertinent positives noted in the  HPI.     Studies Reviewed: SABRA       CMR 10/27/2024 IMPRESSION: 1. Morphologic findings consistent with apical variant hypertrophic cardiomyopathy. Apical septal wall thickness 19 mm relative to basal septum 11 mm. Small spade like ventricle with 58% LV diameter cavity length obliteration in systole. Small apical channel with small area of apical akinesis. LVEF 72%   2. Hyper-enhancement in the subendocardial distal septum, anterior wall true apex and apical lateral walls. Total percent of hyper-enhancement 14/6% which has increased since MRI done 03/10/20 when uptake was only 9%   3.  Normal RV size and function RVEF 62%   4.  Normal parametric measures see values above   5.  No significant valve disease   6.  Estimated cardiac output 6.2 L/min  Physical Exam:   VS:  BP (!) 150/72 (BP Location: Left Arm, Patient Position: Sitting, Cuff Size: Large)   Pulse 82   Ht 5' 3 (1.6 m)   Wt (!) 303 lb 12.8 oz (137.8 kg)   SpO2 97%   BMI 53.82 kg/m    Wt Readings from Last 3 Encounters:  11/10/24 (!) 303 lb 12.8 oz (137.8 kg)  10/02/24 (!) 307 lb 12.8 oz (139.6 kg)  02/15/23 (!) 301 lb (136.5 kg)    GEN: Well nourished, well developed in no acute distress NECK: No JVD; No carotid bruits CARDIAC: RRR, no murmurs, rubs, gallops RESPIRATORY:  Clear  to auscultation without rales, wheezing or rhonchi  ABDOMEN: Soft, non-tender, non-distended EXTREMITIES:  No edema; No deformity  ASSESSMENT AND PLAN: .   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 Commercial driving contraindicated. - Referred to Dr. Santo for second opinion on hypertrophic cardiomyopathy. - Referred to Dr. Almetta for ICD evaluation. - Issued letter prohibiting commercial driving due to heart condition. -  Discussed short-term disability options.  Coronary artery disease without angina Coronary artery disease present without angina symptoms.  Essential hypertension Blood pressure elevated. - Continue metoprolol  succinate 25 mg daily. - Continue valsartan  160 mg/HCTZ 12.5 mg daily. - Monitor blood pressure periodically.  Mixed hyperlipidemia Managed with atorvastatin . - Refilled atorvastatin  80 mg.  Type 2 diabetes mellitus Recent A1c of 7.0%. - Continue metformin  500 mg daily.  Morbid obesity BMI of 53.                Follow-up: Return in about 3 months (around 02/08/2025).  Signed, Darryle DASEN. Barbaraann, MD, [facility]  [facility]  [address] [place], [place] [address] [phone]  1:36 PM

## 2024-11-10 ENCOUNTER — Ambulatory Visit: Attending: Cardiovascular Disease | Admitting: Cardiovascular Disease

## 2024-11-10 ENCOUNTER — Encounter: Payer: Self-pay | Admitting: Cardiovascular Disease

## 2024-11-10 VITALS — BP 150/72 | HR 82 | Ht 63.0 in | Wt 303.8 lb

## 2024-11-10 DIAGNOSIS — I422 Other hypertrophic cardiomyopathy: Secondary | ICD-10-CM

## 2024-11-10 DIAGNOSIS — E782 Mixed hyperlipidemia: Secondary | ICD-10-CM

## 2024-11-10 DIAGNOSIS — I10 Essential (primary) hypertension: Secondary | ICD-10-CM

## 2024-11-10 DIAGNOSIS — I2510 Atherosclerotic heart disease of native coronary artery without angina pectoris: Secondary | ICD-10-CM

## 2024-11-10 DIAGNOSIS — Z7689 Persons encountering health services in other specified circumstances: Secondary | ICD-10-CM

## 2024-11-10 MED ORDER — ATORVASTATIN CALCIUM 80 MG PO TABS: 80.0000 mg | ORAL_TABLET | Freq: Every day | ORAL | 3 refills | Status: AC

## 2024-11-10 NOTE — Addendum Note (Signed)
 Addended by: MELIDA ROLIN HERO on: 11/10/2024 02:31 PM   Modules accepted: Orders

## 2024-11-10 NOTE — Patient Instructions (Signed)
 Medication Instructions:  Your physician recommends that you continue on your current medications as directed. Please refer to the Current Medication list given to you today.  *If you need a refill on your cardiac medications before your next appointment, please call your pharmacy*   Follow-Up: At [facility], you and your health needs are our priority.  As part of our continuing mission to provide you with exceptional heart care, our providers are all part of one team.  This team includes your primary Cardiologist (physician) and Advanced Practice Providers or APPs (Physician Assistants and Nurse Practitioners) who all work together to provide you with the care you need, when you need it.  Your next appointment:   3 month(s)  Provider:   Darryle ONEIDA Decent, MD

## 2024-12-04 ENCOUNTER — Ambulatory Visit: Attending: Internal Medicine | Admitting: Internal Medicine

## 2024-12-04 ENCOUNTER — Ambulatory Visit: Admitting: Genetic Counselor

## 2024-12-04 DIAGNOSIS — I422 Other hypertrophic cardiomyopathy: Secondary | ICD-10-CM | POA: Insufficient documentation

## 2024-12-04 DIAGNOSIS — G4733 Obstructive sleep apnea (adult) (pediatric): Secondary | ICD-10-CM | POA: Insufficient documentation

## 2024-12-04 MED ORDER — METOPROLOL SUCCINATE ER 25 MG PO TB24: 25.0000 mg | ORAL_TABLET | Freq: Every day | ORAL | 3 refills | Status: AC

## 2024-12-04 NOTE — Progress Notes (Signed)
 " Cardiology Office Note:  .    Date:  12/04/2024  ID:  Lynwood ONEIDA Muzzy, DOB [birth year], MRN [id number] PCP: Leigh Lung, MD  [facility] Providers Cardiologist:  Darryle ONEIDA Decent, MD     CC: Second Opinion- Driving Consulted for the evaluation of ApHCM with Aneurysm at the behest of Dr. Decent  History of Present Illness: .    NICHOLAS TROMPETER is a 54 y.o. 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 He experiences occasional shortness of breath and mild chest pain, particularly with exertion, such as walking or moving around his truck. He also feels palpitations, especially when sitting down after walking. He is not currently taking metoprolol , which was previously prescribed to manage his heart rate.  He has non-obstructive coronary artery disease and has undergone evaluations for blockages, which revealed non-obstructive hypertrophic cardiomyopathy. His EKG has been abnormal, raising concerns for heart attacks, but these findings are attributed to his cardiomyopathy. He has not experienced any episodes of syncope.  He has super morbid obesity, which affects his health and contributes to his symptoms. 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 He has obstructive sleep apnea and uses a CPAP machine, which has improved his apnea-hypopnea index from 44 to 34.9.  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  He has a family history of a brain aneurysm, as his mother passed away from one in 26. He has one daughter, aged 50, and there is consideration for genetic testing to assess her risk.  Discussed  the use of AI scribe software for clinical note transcription with the patient, who gave verbal consent to proceed.   Relevant histories: .  Social  - former truck driver ROS: As per HPI.   Studies Reviewed: .     Cardiac Studies & Procedures   ______________________________________________________________________________________________ CARDIAC CATHETERIZATION  CARDIAC CATHETERIZATION 03/09/2020  Conclusion  Ost RCA lesion is 40% stenosed. No catheter dampening.  Hazy, Mid Cx lesion is 40% stenosed. Cross sectional area by IVUS 6.24 mm. No dissection or thrombus noted.  LV end diastolic pressure is moderately elevated.  There is no aortic valve stenosis.  Catheter induced vasospasm of the ostial left main, which resolved with IC NTG.  Continue aggressive risk factor modification including weight loss and DM control.  Consider MRI to evaluate for myocarditis.  Consider using amlodipine  or nitrates to treat spasm.  Findings Coronary Findings Diagnostic  Dominance: Right  Left Circumflex Mid Cx lesion is 40% stenosed. The lesion is eccentric. Ultrasound (IVUS) was performed. Moderate plaque burden was detected. no dissection or thrombus noted by IVUS.  Right Coronary Artery Ost RCA lesion is 40% stenosed. no catheter dampening  Intervention  No interventions have been documented.     ECHOCARDIOGRAM  ECHOCARDIOGRAM COMPLETE 12/18/2021  Narrative ECHOCARDIOGRAM REPORT    Patient Name:   CONLIN BRAHM Date of Exam: 12/18/2021 Medical Rec #:  [id number]      Height:       64.0 in Accession #:    [id number]     Weight:       290.0 lb Date of Birth:  07-22-1971  BSA:          2.291 m Patient Age:    51 years       BP:           129/76 mmHg Patient Gender: M              HR:           102 bpm. Exam Location:  [address]  Procedure: 2D Echo, Cardiac Doppler, Color Doppler and Intracardiac Opacification Agent  Indications:    I42.2 Hypertrophic  cardiomyopathy  History:        Patient has prior history of Echocardiogram examinations, most recent 03/09/2020. Cardiomyopathy, Previous Myocardial Infarction; Risk Factors:Diabetes, Hypertension and Dyslipidemia. Morbid obesity. Obstructive sleep apnea. Apical variant hypertrophic cardiomyopathy.  Sonographer:    Jon Hacker RCS Referring Phys: DARRYLE NED O'NEAL  IMPRESSIONS   1. Poor acoustic windows limit study, particularly in apical views. LV cavity becomes small/slit-like towards apex The LV is hyperdynamic with near cavity obliteration during systole. With Valsalva peak gradient through LV is 55 mm Hg . Left ventricular ejection fraction, by estimation, is >75%. The left ventricle has hyperdynamic function. The left ventricle has no regional wall motion abnormalities. The left ventricular internal cavity size was low normal. Moderate to severe left ventricular hypertrophy. 2. Right ventricular systolic function is normal. The right ventricular size is normal. 3. Trivial mitral valve regurgitation.  FINDINGS Left Ventricle: Poor acoustic windows limit study, particularly in apical views. LV cavity becomes small/slit-like towards apex The LV is hyperdynamic with near cavity obliteration during systole. With Valsalva peak gradient through LV is 55 mm Hg. Left ventricular ejection fraction, by estimation, is >75%. The left ventricle has hyperdynamic function. The left ventricle has no regional wall motion abnormalities. The left ventricular internal cavity size was low normal. Moderate to severe left ventricular hypertrophy. Left ventricular diastolic parameters are indeterminate.  Right Ventricle: The right ventricular size is normal. Right vetricular wall thickness was not assessed. Right ventricular systolic function is normal.  Left Atrium: Left atrial size was normal in size.  Right Atrium: Right atrial size was normal in size.  Pericardium: There is no evidence of  pericardial effusion.  Mitral Valve: Mild mitral annular calcification. Trivial mitral valve regurgitation.  Tricuspid Valve: The tricuspid valve is normal in structure. Tricuspid valve regurgitation is trivial.  Aortic Valve: The aortic valve is normal in structure. Aortic valve regurgitation is not visualized.  Pulmonic Valve: The pulmonic valve was not well visualized. Pulmonic valve regurgitation is not visualized.  Aorta: The aortic root and ascending aorta are structurally normal, with no evidence of dilitation.  IAS/Shunts: No atrial level shunt detected by color flow Doppler.   LEFT VENTRICLE PLAX 2D LVIDd:         3.53 cm   Diastology LVIDs:         2.00 cm   LV e' medial:    8.27 cm/s LV PW:         1.43 cm   LV E/e' medial:  11.3 LV IVS:        1.68 cm   LV e' lateral:   7.53 cm/s LVOT diam:     1.90 cm   LV E/e' lateral: 12.4 LV SV:         56 LV SV Index:   25 LVOT Area:     2.84 cm   RIGHT VENTRICLE RV S prime:     12.40 cm/s  LEFT ATRIUM  Index LA diam:        3.70 cm 1.61 cm/m LA Vol (A2C):   26.0 ml 11.35 ml/m LA Vol (A4C):   22.5 ml 9.82 ml/m LA Biplane Vol: 24.3 ml 10.61 ml/m AORTIC VALVE LVOT Vmax:   125.00 cm/s LVOT Vmean:  86.300 cm/s LVOT VTI:    0.199 m  AORTA Ao Root diam: 3.10 cm Ao Asc diam:  2.70 cm  MITRAL VALVE MV Area (PHT): 5.62 cm     SHUNTS MV Decel Time: 135 msec     Systemic VTI:  0.20 m MV E velocity: 93.30 cm/s   Systemic Diam: 1.90 cm MV A velocity: 105.00 cm/s MV E/A ratio:  0.89  Vina Gull MD Electronically signed by Vina Gull MD Signature Date/Time: 12/18/2021/2:59:29 PM    Final    MONITORS  LONG TERM MONITOR (3-14 DAYS) 03/08/2023  Narrative Patch Wear Time:  1 days and 18 hours (2024-03-31T03:11:04-399 to 2024-04-01T22:02:08-399)  Patient had a min HR of 54 bpm (sinus bradycardia), max HR of 131 bpm (sinus tachycardia), and avg HR of 92 bpm (normal sinus rhythm). Predominant underlying  rhythm was Sinus Rhythm. Isolated SVEs were frequent (6.7%, 14717), SVE Couplets were rare (<1.0%, 1), and no SVE Triplets were present. Isolated VEs were rare (<1.0%), and no VE Couplets or VE Triplets were present.  Impression: No ventricular tachycardia detected. Frequent PACs (6.7% burden).  Darryle T. Barbaraann, MD, [facility]  [facility] [address], [address] [place], [place] [address] [phone] 7:48 PM     CARDIAC MRI  MR CARDIAC MORPHOLOGY W WO CONTRAST 10/27/2024  Narrative CLINICAL DATA:  Apical variant Hypertrophic Cardiomyopathy  EXAM: CARDIAC MRI Comparison 03/10/2020  TECHNIQUE: The patient was scanned on a 1.5 Tesla GE magnet. A dedicated cardiac coil was used. Functional imaging was done using Fiesta sequences. 2,3, and 4 chamber views were done to assess for RWMA's. Modified Simpson's rule using a short axis stack was used to calculate an ejection fraction on a dedicated work [employer]. The patient received 16 cc of Gadavist . After 10 minutes inversion recovery sequences were used to assess for infiltration and scar tissue.  CONTRAST:  Gadavist   FINDINGS: Normal atrial sizes. No ASD/PFO. No pericardial effusion. Normal ascending thoracic aorta 3.4 cm. Normal tri leaflet AV. Normal mitral valve. No SAM or significant MR. Normal TV/PV. Normal RV size and function. Small spade like LV cavity with hyperdynamic function. Ratio of LV diameter length cavity obliteration in diastolic vs systole 58%. Small apical tunnel with small area of apical thinning and akinesis. Morphologic findings consistent with apical hypertrophic cardiomyopathy.  Quantitative LVEF: 72% (EDV 124 cc ESV 34 cc SV 90 cc) Estimated cardiac output using flow analysis 6.2 L/min  Quantitative RVEF: 62% (EDV 134 cc ESV 50 cc SV 84 cc )  Delayed enhancement images show subendocardial uptake in the distal septum, anterior wall, distal lateral wall and true  apex.  Total volume of hyper-enhancement 14.6%  Parametric measures using Hct of 44  T1: Upper normal 1052 msec ECV: Normal 24% T2: Normal 47 msec  IMPRESSION: 1. Morphologic findings consistent with apical variant hypertrophic cardiomyopathy. Apical septal wall thickness 19 mm relative to basal septum 11 mm. Small spade like ventricle with 58% LV diameter cavity length obliteration in systole. Small apical channel with small area of apical akinesis. LVEF 72%  2. Hyper-enhancement in the subendocardial distal septum, anterior wall true apex and apical lateral walls. Total percent of hyper-enhancement 14/6% which has  increased since MRI done 03/10/20 when uptake was only 9%  3.  Normal RV size and function RVEF 62%  4.  Normal parametric measures see values above  5.  No significant valve disease  6.  Estimated cardiac output 6.2 L/min  Maude Emmer   Electronically Signed By: Maude Emmer M.D. On: 10/27/2024 12:38   ______________________________________________________________________________________________      Physical Exam:    VS:  BP 135/89 (BP Location: Right Arm)   Pulse 94   Ht 5' 4 (1.626 m)   Wt (!) 304 lb (137.9 kg)   SpO2 96%   BMI 52.18 kg/m    Wt Readings from Last 3 Encounters:  12/04/24 (!) 304 lb (137.9 kg)  11/10/24 (!) 303 lb 12.8 oz (137.8 kg)  10/02/24 (!) 307 lb 12.8 oz (139.6 kg)    Gen: no distress, morbid obesity   Neck: No JVD  Cardiac: No Rubs or Gallops, systolic murmur, RRR +2 radial pulses Respiratory: Clear to auscultation bilaterally, normal effort, normal  respiratory rate GI: Soft, nontender, non-distended  MS: No  edema;  moves all extremities Integument: Skin feels warm Neuro:  At time of evaluation, alert and oriented to person/place/time/situation  Psych: Normal affect, patient feels ok     ASSESSMENT AND PLAN: .    Hypertrophic Cardiomyopathy  - Apical Variant - with small < 1.94 cm2 apical aneurysm. -  suspicion of Fabry's/Danon/Noonan's or other mimics of HCM: low - Gene variant: Pending - NYHA II  - Non HCM Contributors to disease/status  Obstructive sleep apnea Diagnosed with obstructive sleep apnea. Apnea-hypopnea index improved from 44 to 34.9 with treatment. Discussed potential benefits of GLP-1 receptor agonist therapy for weight loss, which may improve sleep apnea symptoms. - Continue CPAP therapy. - Will consider GLP-1 receptor agonist therapy for weight loss and potential improvement in sleep apnea.  Type 2 diabetes mellitus Type 2 diabetes managed with metformin  and dietary changes. Discussed potential benefits of GLP-1 receptor agonist therapy for weight loss and improvement in blood glucose control. - Continue metformin  therapy. - Will consider GLP-1 receptor agonist therapy for weight loss and blood glucose control.  Super morbid obesity Contributing to cardiovascular risk factors. Discussed challenges with weight loss and potential benefits of GLP-1 receptor agonist therapy. Emphasized importance of lifestyle modifications including diet and exercise. Discussed potential for clinical trial participation in 2026 for nonobstructive hypertrophic cardiomyopathy. - Will consider GLP-1 receptor agonist therapy for weight loss. - Encouraged dietary modifications focusing on increased protein and fiber intake, and reduced carbohydrate intake. - Encouraged regular physical activity, including strength training and cardio exercises.  Family history Reviewed, Discussed family screening  - echo screening for his daughter, will consider genetic testing today   Atrial fibrillation Assessment  - HCM-AF score 22 - Atrial arrhythmia management: frequent PACs; his risk of AF in the next 5 years is 12%; this may sway consideration to transvenous system if ICD is pursued SCD  Assessment - No NSVT on 2024 heart monitor - CMR from 2025 notable for new aneurysm - SCD risk estimated to be at  least 3% at 5 years SDM: we have discussed apical aneurysm, age and ICD indication.  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  However, these federal guidelines cannot be superseded by clinical practice guidelines.  The presence of an apical aneurysm is a major risk factor for SCD in HCM, which would disqualify this patient from commercial driving eligibility. The 2024 guidelines specifically identify LV apical aneurysm as one of the newer markers of increased SCD risk. While the traditional SCD risk calculator does not incorporate apical aneurysm, the guidelines acknowledge that apical aneurysm (along with systolic dysfunction and extensive late gadolinium enhancement) represents an additional risk marker beyond the conventional risk factors.  The Psychologist, Clinical Administration cardiovascular disease guidelines, updated in 2015, establish the framework that the ACC/AHA guidelines reference. The rationale for these restrictions centers on preventing loss of consciousness events that could endanger the driver, passengers, and the public.  Symptom plan: - Apical hypertrophic cardiomyopathy with a small apical aneurysm measuring under 1.97 cm. Presence of late gadolinium enhancement indicating scar burden. Increased risk of sudden cardiac death due to apical aneurysm and scar burden. 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 Discussed potential for defibrillator placement, considering subcutaneous ICD and transvenous ICD options. Weight loss may influence device  choice. Discussed genetic testing for familial risk assessment. - Referred to Dr. Almetta for defibrillator consultation. - Met with clinical geneticist for genetic testing discussion. - Refilled metoprolol  for heart rate control. - Discussed weight loss strategies to potentially improve cardiac condition.  Six month f/u with me or Tessa if CIRRUS-HCM  Launches, otherwise we can bring back to co-support with Dr. Barbaraann  Time:   I have spent a total of 66 minutes with the patient reviewing notes, imaging, EKGs, labs, and examining the patient as well as establishing an assessment and plan that was discussed personally with the patient. Discussed disease state education , using shared decision making tools and cardiac modeling , and FMCSA legislation. Reviewed care and plan in collaboration with clinical genetics.   Stanly Leavens, MD FASE [facility] Cardiologist [facility]  [address] [place], [address] [place], [place] [address] [phone]  11:25 AM  "

## 2024-12-04 NOTE — Patient Instructions (Signed)
 Medication Instructions:  Your physician has recommended you make the following change in your medication:   RESTART: metoprolol  succinate (Toprol  XL) 25 mg by mouth once daily   *If you need a refill on your cardiac medications before your next appointment, please call your pharmacy*  Lab Work: NONE  If you have labs (blood work) drawn today and your tests are completely normal, you will receive your results only by: MyChart Message (if you have MyChart) OR A paper copy in the mail If you have any lab test that is abnormal or we need to change your treatment, we will call you to review the results.  Testing/Procedures: Your physician has referred you to Electrophysiology (EP).    Follow-Up: At [facility], you and your health needs are our priority.  As part of our continuing mission to provide you with exceptional heart care, our providers are all part of one team.  This team includes your primary Cardiologist (physician) and Advanced Practice Providers or APPs (Physician Assistants and Nurse Practitioners) who all work together to provide you with the care you need, when you need it.  Your next appointment:   6 month(s)  Provider:   Stanly Leavens, MD    Other Instructions

## 2024-12-08 NOTE — Progress Notes (Signed)
 Pre Test Genetic Consult  Referral Reason  Reginald Gonzalez, a new HCM patient, is referred for genetic consult and testing of hypertrophic cardiomyopathy.   Personal Medical Information Reginald Gonzalez (III.1 on pedigree) is a 54 year old African American gentleman who used to work as a airline pilot. He reports being evaluated for CAD and was found to have apical cardiac hypertrophy about 2 years ago.   Cardiac MRI (10/27/24) demonstrated apical septal wall thickness of 1.9 cm relative to basal septum of 1.1 cm with small spade like ventricle and LVEF 72% with small apical aneurysm.  He reports being symptomatic for the last 2 years with dyspnea, and occasional fatigue, chest tightness and heart palpitations. Denies dizziness or syncope.  Traditional Risk Factors Reginald Gonzalez was diagnosed with HTN in his 30s and reports that it is well-controlled with medication.  Family history  Relation to Proband Pedigree # Current age Heart condition/age of onset Notes  Daughter IV.1 56 None Disabled, lives with her mother. Echo/EKG to be done  Grandchildren None           Brothers, 2 III.2, III.3 50, 48 None Echo/EKG to be done  Nephew, nieces IV.2-IV.7 31-20s None         Father II.16 32 None   Paternal uncles, 12 II.4-II.15 70s-60s Deceased None 82- died of old age   Paternal aunts, 3 II.1-II.3 70s-60s None   Paternal grandfather 1.1 Deceased None Died @ 15s -old age  Paternal grandmother I.2 Deceased None Died @ 64s-old age        Mother II.17 Deceased None Died @ 43- brain aneurysm  Maternal aunts, 3 II.18-II.20 70s-60s None   Maternal uncles, 3 II.21-II.23 70s-60s Deceased None Died @ 2- poor health  Maternal grandfather I.3 Deceased None Died @ 37s- old age  Maternal grandmother I.4 Deceased None Died @ 69- cancer   Genetics Reginald Gonzalez was counseled on the genetics of hypertrophic cardiomyopathy (HCM). I explained to the patient that this is an autosomal dominant condition with incomplete penetrance  i.e. not all individuals harboring the HCM mutation will present clinically with HCM, and age-related penetrance where clinical presentation of HCM increases with advanced age. Variability in clinical expression is also seen in families with HCM with affected family members presenting clinically at different ages and with symptoms ranging from mild to severe.  Since HCM is an autosomal dominant condition, first degree-relatives are at a 50% risk of inheriting this condition. They should seek regular surveillance for HCM.  First-degree relatives include his parents, brother and sister. At this time his grandchildren, nephew and niece do not need to undergo screening- they can be screened if they are symptomatic or if their parent is found to have HCM.    Clinical screening of first-degree relatives involves echocardiogram and EKG at regular intervals, frequency is typically determined by age, with children undergoing screening every year until the age of 52 and those over the age of 22 getting screened every 3-5 years until the age of 44. Patient verbalized understanding of this.  Also briefly discussed the inheritance pattern and treatment /management plans for the infiltrative cardiomyopathies that present as HCM phenocopies. About 8-10% of HCM patients can have compound and digenic sarcomeric mutations for HCM  Patient should be aware that genetic testing is a probabilistic test dependent upon age and severity of presentation, presence of risk factors for HCM and importantly family history of HCM or sudden death in first-degree relatives. The potential outcomes of genetic testing and subsequent management of  at-risk family members is listed below-  If a mutation is not identified then, it is important that he understands that HCM is a genetic condition and can be passed down to his children. All first-degree relatives should undergo regular screening for HCM.  A negative test result can be due to  limitations of the genetic test.   There is also the likelihood of identifying a Variant of unknown significance. This result means that the variant has not been detected in a statistically significant number of HCM patients and/or functional studies have not been performed to verify its pathogenicity. This VUS can be tested in the family to see if it segregates with disease. If a VUS is found, first-degree relatives should undergo regular clinical screening for HCM, but genetic testing for the VUS is otherwise not warranted.  If a pathogenic variant is reported, then first-degree family members can get tested for this variant. If they test positive, it is likely they will develop HCM. In light of variable expression and incomplete penetrance associated with HCM, it is not possible to predict when they will manifest clinically with HCM. It is recommended that family members that test positive for the familial pathogenic variant pursue clinical screening for HCM. Family members that test negative for the familial mutation need not pursue periodic screening for HCM, but seek care if symptoms develop.   Impression  Reginald Gonzalez was found to have cardiac wall thickness suggestive of HCM at age 2 in the absence of other cardiac loading conditions that can lead to cardiac hypertrophy. There is no family history of HCM or sudden death. It is likely he has a de novo mutation for HCM or has inherited this from one of his parents with evident reduced penetrance.  Genetic testing is recommended to confirm his diagnosis. This test should include the major sarcomeric genes involved in HCM, namely MYBPPC3, MYH7, TNNI3, TNNT2, TPM1, ACTC1, MYL2 and MYL3. It should also include the genes involved in HCM phenocopies as cardiac-predominant forms of these conditions present clinically as HCM. These include genes for Fabry disease (GLA), Danon disease (LAMP2), WPW syndrome (PRKAG2), Familial transthyretin amyloidosis (TTR) and the  genes definitively associated with HCM, namely phospholamban (PLN) and desmin (DES).  In addition, patient should be aware of protections afforded by the Genetic Information Non-Discrimination Act (GINA). GINA protects a patient from losing their employment or health insurance based on their genotype. However, these protections do not cover life insurance and disability. Explained to the patient that family members that are found to have the familial genetic mutation will be denied life insurance even if they are asymptomatic and do not exhibit clinical signs of HCM. Patient verbalized understanding and will discuss this with their family.   Please note that the patient has not been counseled in this visit on other personal, cultural or ethical issues that patient may face due to their heart condition.   Plan After a thorough discussion of the risk and benefits of genetic testing for HCM, Reginald Gonzalez declines genetic testing due to insurance non-coverage for the HCM test and considering that his brothers are not willing to undergo screening for HCM. He will, nevertheless, discuss HCM screening guidelines and procuring life insurance with his brothers and daughter.      Danford Pac, Ph.D, [facility] Clinical Molecular Geneticist

## 2024-12-10 ENCOUNTER — Other Ambulatory Visit (HOSPITAL_COMMUNITY): Payer: Self-pay

## 2024-12-10 ENCOUNTER — Telehealth: Payer: Self-pay | Admitting: Pharmacy Technician

## 2024-12-10 ENCOUNTER — Telehealth: Payer: Self-pay | Admitting: Pharmacist

## 2024-12-10 NOTE — Telephone Encounter (Signed)
 Pharmacy Patient Advocate Encounter   Received notification from Patient Advice Request messages that prior authorization for zepbound 2.5 MG is required/requested.   Insurance verification completed.   The patient is insured through [employer].   Per test claim: PA required; PA submitted to above mentioned insurance via Latent Key/confirmation #/EOC [id number] Status is pending

## 2024-12-10 NOTE — Telephone Encounter (Signed)
-----   Message from Stanly Leavens, MD sent at 12/04/2024 11:34 AM EST ----- I heard Ileana was leaving! I am sorry to hear this.  Do you know who I should reach out to now about possible GLP1RA coverage for this young man with an AHI on 34.9?  Thanks, MAC

## 2024-12-11 ENCOUNTER — Other Ambulatory Visit (HOSPITAL_COMMUNITY): Payer: Self-pay

## 2024-12-11 NOTE — Telephone Encounter (Signed)
 Pharmacy Patient Advocate Encounter  Received notification from [employer] that Prior Authorization for zepbound  has been APPROVED from 12/10/24 to 06/08/25. Ran test claim, Copay is $4.00- one month. This test claim was processed through [facility]- copay amounts may vary at other pharmacies due to pharmacy/plan contracts, or as the patient moves through the different stages of their insurance plan.   PA #/Case ID/Reference #: [id number]

## 2024-12-15 NOTE — Progress Notes (Unsigned)
 Patient ID: Reginald Gonzalez                 DOB: July 20, 1971                    MRN: [id number]     HPI: Reginald Gonzalez is a 54 y.o. male patient referred to pharmacy clinic by Dr. Santo  to initiate GLP1-RA therapy. PMH is significant for non-obstructive hypertrophic cardiomyopathy, an apical aneurysm, OSA, T2DM, HTN, HLD, and obesity. Most recent BMI 52.16 kg/m .  Baseline weight and BMI: 304 lbs; 52.16 kg/m  Current weight and BMI: 52.16 kg/m  Current meds that affect weight: Valsartan /HCTZ Most Recent BP: 135/89  Most recent A1c: 7   *** Follow-up visit  Assess % weight loss Assess adverse effects Missed doses  Diet:   Exercise:   Family History:   Relation Problem Comments  Mother (Deceased) Hypertension Brain Aneurysm      Father Metallurgist) Healthy      Social History:  Alcohol Smoking:   Labs: Lab Results  Component Value Date   HGBA1C 6.4 (H) 03/09/2020    Wt Readings from Last 1 Encounters:  12/04/24 (!) 304 lb (137.9 kg)    BP Readings from Last 1 Encounters:  12/04/24 135/89   Pulse Readings from Last 1 Encounters:  12/04/24 94       Component Value Date/Time   CHOL 165 10/02/2024 1044   TRIG 215 (H) 10/02/2024 1044   HDL 40 10/02/2024 1044   CHOLHDL 4.1 10/02/2024 1044   CHOLHDL 4.5 03/09/2020 0405   VLDL 13 03/09/2020 0405   LDLCALC 89 10/02/2024 1044    Past Medical History:  Diagnosis Date   Diabetes mellitus    Hypertension     Medications Ordered Prior to Encounter[1]  Allergies[2]   Assessment/Plan:  1. Weight loss -   No problems updated. No problem-specific Assessment & Plan notes found for this encounter.  Patient has not met goal of at least 5% of body weight loss with comprehensive lifestyle modifications alone in the past 3-6 months. Pharmacotherapy is appropriate to pursue as augmentation. Will start*** . Confirmed patient not ***pregnant and no personal or family history of medullary thyroid carcinoma  (MTC) or Multiple Endocrine Neoplasia syndrome type 2 (MEN 2). Injection technique reviewed at today's visit.  Advised patient on common side effects including nausea, diarrhea, dyspepsia, decreased appetite, and fatigue. Counseled patient on reducing meal size and how to titrate medication to minimize side effects. Counseled patient to call if intolerable side effects or if experiencing dehydration, abdominal pain, or dizziness. Along with pharmacotherapy, the patient will follow dietary modifications and aim for at least 150 minutes of moderate-intensity exercise per week, plus resistance training twice a week (as recommended by the American Heart Association). This resistance training--such as weightlifting, bodyweight exercises, or using resistance bands, adapted to the patients ability--will help prevent muscle loss.  Follow up in 1-2 days regarding coverage of *** . If therapy is initiated, phone follow-ups will be conducted every 4 weeks for dose titration until the patient reaches the effective therapeutic dose and target weight.   Robbi Blanch, Pharm.D [facility]. [facility] [address], [place], [place] [address] Phone: [phone]; Fax: [phone]      [1]  Current Outpatient Medications on File Prior to Visit  Medication Sig Dispense Refill   acetaminophen  (TYLENOL ) 325 MG tablet Take 650 mg by mouth every 6 (six)  hours as needed for mild pain or headache.     aspirin  EC 81 MG tablet Take 1 tablet (81 mg total) by mouth daily. Swallow whole. 90 tablet 3   atorvastatin  (LIPITOR) 80 MG tablet Take 1 tablet (80 mg total) by mouth daily. 90 tablet 3   ibuprofen  (ADVIL ) 800 MG tablet Take 1 tablet (800 mg total) by mouth 3 (three) times daily. 21 tablet 1   metFORMIN  (GLUCOPHAGE ) 500 MG tablet Take 1 tablet (500 mg total) by mouth daily with breakfast. 90 tablet 1   metoprolol  succinate (TOPROL  XL) 25 MG 24 hr tablet Take 1 tablet (25  mg total) by mouth daily. 90 tablet 3   sildenafil  (VIAGRA ) 25 MG tablet Take 4 tablets (100 mg total) by mouth daily as needed for erectile dysfunction. 30 tablet 0   valsartan -hydrochlorothiazide  (DIOVAN -HCT) 160-12.5 MG tablet Take 1 tablet by mouth daily. 90 tablet 2   No current facility-administered medications on file prior to visit.  [2] No Known Allergies

## 2024-12-16 ENCOUNTER — Encounter: Payer: Self-pay | Admitting: Pharmacist

## 2024-12-16 ENCOUNTER — Ambulatory Visit: Attending: Cardiology | Admitting: Pharmacist

## 2024-12-16 ENCOUNTER — Other Ambulatory Visit (HOSPITAL_COMMUNITY): Payer: Self-pay

## 2024-12-16 VITALS — Ht 64.0 in | Wt 297.0 lb

## 2024-12-16 DIAGNOSIS — Z6841 Body Mass Index (BMI) 40.0 and over, adult: Secondary | ICD-10-CM | POA: Diagnosis present

## 2024-12-16 MED ORDER — ZEPBOUND 2.5 MG/0.5ML ~~LOC~~ SOAJ
2.5000 mg | SUBCUTANEOUS | 0 refills | Status: AC
  Filled 2024-12-16: qty 2, 28d supply, fill #0

## 2024-12-16 NOTE — Assessment & Plan Note (Signed)
 Patient has not met goal of at least 5% of body weight loss with comprehensive lifestyle modifications alone in the past 3-6 months. Pharmacotherapy is appropriate to pursue as augmentation. Will start Zepbound . Confirmed patient not pregnant and no personal or family history of medullary thyroid carcinoma (MTC) or Multiple Endocrine Neoplasia syndrome type 2 (MEN 2). Confirmed no personal history of pancreatitis or gallstone issues. Injection technique reviewed at today's visit.  Advised patient on common side effects including nausea, diarrhea, dyspepsia, decreased appetite, and fatigue. Counseled patient on reducing meal size and how to titrate medication to minimize side effects. Counseled patient to call if intolerable side effects or if experiencing dehydration, abdominal pain, or dizziness. Along with pharmacotherapy, the patient will follow dietary modifications and aim for at least 150 minutes of moderate-intensity exercise per week, plus resistance training twice a week (as recommended by the American Heart Association). This resistance training--such as weightlifting, bodyweight exercises, or using resistance bands, adapted to the patients ability--will help prevent muscle loss.  Phone follow-ups will be conducted every 4 weeks for dose titration until the patient reaches the effective therapeutic dose and target weight.

## 2025-01-15 ENCOUNTER — Ambulatory Visit: Admitting: Student in an Organized Health Care Education/Training Program
